# Patient Record
Sex: Female | Born: 1970 | Race: White | Hispanic: No
Health system: Midwestern US, Academic
[De-identification: ages and names within clinical notes are randomized; demographics above are authoritative.]

---

## 2018-03-16 ENCOUNTER — Ambulatory Visit: Admit: 2018-03-16 | Discharge: 2018-03-17 | Payer: MEDICARE

## 2018-03-16 ENCOUNTER — Encounter: Admit: 2018-03-16 | Discharge: 2018-03-16 | Payer: MEDICARE

## 2018-03-16 DIAGNOSIS — I10 Essential (primary) hypertension: ICD-10-CM

## 2018-03-16 DIAGNOSIS — M5116 Intervertebral disc disorders with radiculopathy, lumbar region: Principal | ICD-10-CM

## 2018-03-16 DIAGNOSIS — M48061 Spinal stenosis, lumbar region without neurogenic claudication: ICD-10-CM

## 2018-03-16 DIAGNOSIS — R079 Chest pain, unspecified: Principal | ICD-10-CM

## 2018-03-16 MED ORDER — CYCLOBENZAPRINE 10 MG PO TAB
10 mg | ORAL_TABLET | Freq: Every evening | ORAL | 0 refills | 21.00000 days | Status: AC
Start: 2018-03-16 — End: 2018-08-28

## 2018-03-16 MED ORDER — OXYCODONE-ACETAMINOPHEN 7.5-325 MG PO TAB
1 | ORAL_TABLET | ORAL | 0 refills | Status: SS | PRN
Start: 2018-03-16 — End: 2018-06-23

## 2018-03-16 MED ORDER — GABAPENTIN 600 MG PO TAB
600 mg | ORAL_TABLET | Freq: Three times a day (TID) | ORAL | 11 refills | Status: AC
Start: 2018-03-16 — End: 2019-03-22

## 2018-03-16 MED ORDER — OXYCODONE-ACETAMINOPHEN 7.5-325 MG PO TAB
1 | ORAL_TABLET | ORAL | 0 refills | 2.00000 days | Status: AC | PRN
Start: 2018-03-16 — End: 2018-06-16

## 2018-03-16 MED ORDER — OXYCODONE-ACETAMINOPHEN 7.5-325 MG PO TAB
1 | ORAL_TABLET | ORAL | 0 refills | 2.00000 days | Status: AC | PRN
Start: 2018-03-16 — End: 2018-07-16

## 2018-03-16 NOTE — Telephone Encounter
Neely from [facility] calling to verify Oxycodone prescription. Patient received 5mg  Oxycodone from PCP on 02/06/2018. Per Dr. Samara Deist ok to fill increasing dosage as prescribed today.

## 2018-03-17 ENCOUNTER — Encounter: Admit: 2018-03-17 | Discharge: 2018-03-17 | Payer: Medicare Other

## 2018-03-17 DIAGNOSIS — M5116 Intervertebral disc disorders with radiculopathy, lumbar region: Principal | ICD-10-CM

## 2018-03-24 ENCOUNTER — Encounter: Admit: 2018-03-24 | Discharge: 2018-03-24 | Payer: MEDICARE

## 2018-03-24 DIAGNOSIS — M5116 Intervertebral disc disorders with radiculopathy, lumbar region: Principal | ICD-10-CM

## 2018-04-01 ENCOUNTER — Encounter: Admit: 2018-04-01 | Discharge: 2018-04-01 | Payer: MEDICARE

## 2018-04-01 ENCOUNTER — Ambulatory Visit: Admit: 2018-04-01 | Discharge: 2018-04-02 | Payer: MEDICARE

## 2018-04-01 ENCOUNTER — Encounter: Admit: 2018-04-01 | Discharge: 2018-04-01 | Payer: Medicare Other

## 2018-04-01 DIAGNOSIS — I10 Essential (primary) hypertension: ICD-10-CM

## 2018-04-01 DIAGNOSIS — R079 Chest pain, unspecified: Principal | ICD-10-CM

## 2018-04-01 DIAGNOSIS — M48061 Spinal stenosis, lumbar region without neurogenic claudication: ICD-10-CM

## 2018-04-01 NOTE — Progress Notes

## 2018-04-01 NOTE — Patient Instructions
THANK YOU FOR COMING IN TODAY. PLEASE RESCHEDULE WHEN YOU ARE NO LONGER ON ANTIBIOTICS.

## 2018-04-02 ENCOUNTER — Encounter: Admit: 2018-04-02 | Discharge: 2018-04-02 | Payer: Medicare Other

## 2018-04-02 DIAGNOSIS — M5116 Intervertebral disc disorders with radiculopathy, lumbar region: Principal | ICD-10-CM

## 2018-04-02 DIAGNOSIS — Z538 Procedure and treatment not carried out for other reasons: ICD-10-CM

## 2018-04-08 ENCOUNTER — Encounter: Admit: 2018-04-08 | Discharge: 2018-04-08 | Payer: MEDICARE

## 2018-04-08 DIAGNOSIS — M5416 Radiculopathy, lumbar region: Principal | ICD-10-CM

## 2018-05-29 ENCOUNTER — Encounter: Admit: 2018-05-29 | Discharge: 2018-05-29 | Payer: MEDICARE

## 2018-05-29 NOTE — Progress Notes
Retrospective Drug Utilization review received and reviewed  by Dr. Sela Hua. No changes to prescribed medications at this time.

## 2018-06-15 ENCOUNTER — Encounter: Admit: 2018-06-15 | Discharge: 2018-06-15 | Payer: MEDICARE

## 2018-06-15 ENCOUNTER — Encounter: Admit: 2018-06-15 | Discharge: 2018-06-15 | Payer: Medicare Other

## 2018-06-16 MED ORDER — OXYCODONE-ACETAMINOPHEN 7.5-325 MG PO TAB
1 | ORAL_TABLET | ORAL | 0 refills | 2.00000 days | Status: DC | PRN
Start: 2018-06-16 — End: 2018-06-23

## 2018-06-20 ENCOUNTER — Encounter: Admit: 2018-06-20 | Discharge: 2018-06-20 | Payer: MEDICARE

## 2018-06-20 DIAGNOSIS — J9859 Other diseases of mediastinum, not elsewhere classified: Secondary | ICD-10-CM

## 2018-06-21 ENCOUNTER — Inpatient Hospital Stay: Admit: 2018-06-21 | Discharge: 2018-06-22 | Payer: MEDICARE

## 2018-06-21 ENCOUNTER — Encounter: Admit: 2018-06-21 | Discharge: 2018-06-21 | Payer: Medicare Other

## 2018-06-21 ENCOUNTER — Encounter: Admit: 2018-06-21 | Discharge: 2018-06-21 | Payer: MEDICARE

## 2018-06-21 DIAGNOSIS — M48061 Spinal stenosis, lumbar region without neurogenic claudication: ICD-10-CM

## 2018-06-21 DIAGNOSIS — I10 Essential (primary) hypertension: ICD-10-CM

## 2018-06-21 DIAGNOSIS — R079 Chest pain, unspecified: Principal | ICD-10-CM

## 2018-06-21 LAB — URINALYSIS DIPSTICK REFLEX TO CULTURE
Lab: POSITIVE — AB
Lab: NEGATIVE

## 2018-06-21 LAB — CBC AND DIFF
Lab: 11 g/dL — ABNORMAL LOW (ref 12.0–15.0)
Lab: 14 % (ref 11–15)
Lab: 265 10*3/uL (ref 150–400)
Lab: 27 pg (ref 26–34)
Lab: 33 g/dL (ref 32.0–36.0)
Lab: 34 % — ABNORMAL LOW (ref 36–45)
Lab: 4.2 M/UL — ABNORMAL HIGH (ref 4.0–5.0)
Lab: 4.6 K/UL (ref 4.5–11.0)
Lab: 7.9 FL (ref 7–11)
Lab: 80 FL (ref 80–100)

## 2018-06-21 LAB — URINALYSIS MICROSCOPIC REFLEX TO CULTURE

## 2018-06-21 LAB — PROTIME INR (PT): Lab: 1.2 (ref 0.8–1.2)

## 2018-06-21 LAB — URIC ACID: Lab: 5.2 mg/dL (ref 2.0–7.0)

## 2018-06-21 LAB — COVID-19 (SARS-COV-2) PCR

## 2018-06-21 LAB — MAGNESIUM: Lab: 2.2 mg/dL (ref 1.6–2.6)

## 2018-06-21 LAB — LDH-LACTATE DEHYDROGENASE: Lab: 374 U/L — ABNORMAL HIGH (ref 100–210)

## 2018-06-21 LAB — COMPREHENSIVE METABOLIC PANEL
Lab: 135 MMOL/L — ABNORMAL LOW (ref 137–147)
Lab: 3.9 MMOL/L (ref 3.5–5.1)

## 2018-06-21 LAB — PTT (APTT): Lab: 25 s (ref 24.0–36.5)

## 2018-06-21 LAB — BETA-HCG: Lab: 8 U/L — ABNORMAL HIGH (ref <5)

## 2018-06-21 LAB — FIBRINOGEN: Lab: 709 mg/dL — ABNORMAL HIGH (ref 200–400)

## 2018-06-21 LAB — PHOSPHORUS: Lab: 4.4 mg/dL (ref 2.0–4.5)

## 2018-06-21 MED ORDER — IOHEXOL 350 MG IODINE/ML IV SOLN
80 mL | Freq: Once | INTRAVENOUS | 0 refills | Status: CP
Start: 2018-06-21 — End: ?
  Administered 2018-06-21: 18:00:00 80 mL via INTRAVENOUS

## 2018-06-21 MED ORDER — SERTRALINE 25 MG PO TAB
25 mg | Freq: Every day | ORAL | 0 refills | Status: DC
Start: 2018-06-21 — End: 2018-06-21

## 2018-06-21 MED ORDER — GABAPENTIN 300 MG PO CAP
600 mg | Freq: Three times a day (TID) | ORAL | 0 refills | Status: DC
Start: 2018-06-21 — End: 2018-06-26
  Administered 2018-06-21 – 2018-06-26 (×16): 600 mg via ORAL

## 2018-06-21 MED ORDER — LISINOPRIL 20 MG PO TAB
20 mg | Freq: Every evening | ORAL | 0 refills | Status: DC
Start: 2018-06-21 — End: 2018-06-22
  Administered 2018-06-22: 02:00:00 20 mg via ORAL

## 2018-06-21 MED ORDER — CYCLOBENZAPRINE 10 MG PO TAB
10 mg | Freq: Every evening | ORAL | 0 refills | Status: DC
Start: 2018-06-21 — End: 2018-06-26
  Administered 2018-06-22 – 2018-06-26 (×5): 10 mg via ORAL

## 2018-06-21 MED ORDER — ESCITALOPRAM OXALATE 10 MG PO TAB
10 mg | Freq: Every day | ORAL | 0 refills | Status: DC
Start: 2018-06-21 — End: 2018-06-26
  Administered 2018-06-21 – 2018-06-26 (×6): 10 mg via ORAL

## 2018-06-21 MED ORDER — ENOXAPARIN 40 MG/0.4 ML SC SYRG
40 mg | Freq: Every day | SUBCUTANEOUS | 0 refills | Status: DC
Start: 2018-06-21 — End: 2018-06-21

## 2018-06-21 MED ORDER — CEFTRIAXONE INJ 1GM IVP
1 g | INTRAVENOUS | 0 refills | Status: DC
Start: 2018-06-21 — End: 2018-06-23
  Administered 2018-06-21 – 2018-06-22 (×2): 1 g via INTRAVENOUS

## 2018-06-21 MED ORDER — LACTATED RINGERS IV SOLP
500 mL | Freq: Once | INTRAVENOUS | 0 refills | Status: CP
Start: 2018-06-21 — End: ?
  Administered 2018-06-21: 15:00:00 500 mL via INTRAVENOUS

## 2018-06-21 MED ORDER — SODIUM CHLORIDE 0.9 % IJ SOLN
50 mL | Freq: Once | INTRAVENOUS | 0 refills | Status: CP
Start: 2018-06-21 — End: ?
  Administered 2018-06-21: 18:00:00 50 mL via INTRAVENOUS

## 2018-06-21 MED ORDER — CETIRIZINE 10 MG PO TAB
10 mg | Freq: Every morning | ORAL | 0 refills | Status: DC
Start: 2018-06-21 — End: 2018-06-26
  Administered 2018-06-21 – 2018-06-26 (×6): 10 mg via ORAL

## 2018-06-21 MED ORDER — NALOXEGOL 12.5 MG PO TAB
12.5 mg | Freq: Every day | ORAL | 0 refills | Status: DC
Start: 2018-06-21 — End: 2018-06-21

## 2018-06-21 MED ORDER — PANTOPRAZOLE 40 MG PO TBEC
40 mg | Freq: Two times a day (BID) | ORAL | 0 refills | Status: DC
Start: 2018-06-21 — End: 2018-06-21

## 2018-06-21 MED ORDER — NITROGLYCERIN 0.4 MG SL SUBL
.4 mg | SUBLINGUAL | 0 refills | Status: DC | PRN
Start: 2018-06-21 — End: 2018-06-21

## 2018-06-21 MED ORDER — OXYCODONE-ACETAMINOPHEN 5-325 MG PO TAB
1-2 | ORAL | 0 refills | Status: DC | PRN
Start: 2018-06-21 — End: 2018-06-26
  Administered 2018-06-22 – 2018-06-26 (×6): 1 via ORAL

## 2018-06-21 MED ORDER — IMS MIXTURE TEMPLATE
800 mg | Freq: Three times a day (TID) | ORAL | 0 refills | Status: DC
Start: 2018-06-21 — End: 2018-06-21

## 2018-06-21 NOTE — Progress Notes
Presented to ED with cough x 1 month with SOA, worse tonight     CXR --> mediastinal mass     CT scan --> anterior middle mediastinal mass measuring 4.2cmx 9.7cm x 11.9cm    probable lymphoma  cardiophrenic nerves enlarged     VS:  100/60 - 80 - 16  -94% RA 98.0    Pt reports felling SOA but resp are even and unlabored     WBC 7.7   Hgb 11.1  Hct 35.1  Plt 236  Neutro 79  Lymphs 11  NA 134  K 4.2  Cl 100  CO2 24  Gap 14  BUN 11  Creat 0.83    Albuterol given     PMH:  HTN  Depression   Chronic back pain   Splenectomy   Hysterectomy     Request for transfer for  higher level of care given new CT finding.  OSH physician is concerned about discharging patient home.

## 2018-06-22 ENCOUNTER — Encounter: Admit: 2018-06-22 | Discharge: 2018-06-22 | Payer: MEDICARE

## 2018-06-22 ENCOUNTER — Encounter: Admit: 2018-06-22 | Discharge: 2018-06-22 | Payer: Medicare Other

## 2018-06-22 LAB — HEPATITIS B SURFACE AG

## 2018-06-22 LAB — CBC AND DIFF: Lab: 3.7 10*3/uL — ABNORMAL LOW (ref 4.5–11.0)

## 2018-06-22 LAB — URIC ACID: Lab: 3.9 mg/dL — ABNORMAL LOW (ref 2.0–7.0)

## 2018-06-22 LAB — COMPREHENSIVE METABOLIC PANEL
Lab: 0.7 mg/dL — ABNORMAL HIGH (ref 0.4–1.00)
Lab: 138 MMOL/L — ABNORMAL HIGH (ref >60)

## 2018-06-22 LAB — PERIPHERAL SMEAR

## 2018-06-22 LAB — LDH-LACTATE DEHYDROGENASE: Lab: 305 U/L — ABNORMAL HIGH (ref >60)

## 2018-06-22 LAB — LEUKEMIA/LYMPHOMA PANEL FLUID/TISSUE

## 2018-06-22 LAB — HIV 1& 2 AG-AB SCRN W REFLEX HIV 1 PCR QUANT

## 2018-06-22 LAB — HEPATITIS B CORE AB TOT (IGG+IGM)

## 2018-06-22 MED ORDER — IOHEXOL 300 MG IODINE/ML IV SOLN
2 mL | Freq: Once | INTRAVENOUS | 0 refills | Status: CP
Start: 2018-06-22 — End: ?
  Administered 2018-06-22: 17:00:00 2 mL via INTRAVENOUS

## 2018-06-22 MED ORDER — MELATONIN 3 MG PO TAB
3 mg | Freq: Once | ORAL | 0 refills | Status: CP
Start: 2018-06-22 — End: ?

## 2018-06-22 MED ORDER — MIDAZOLAM 1 MG/ML IJ SOLN
0 refills | Status: CP
Start: 2018-06-22 — End: ?
  Administered 2018-06-22 (×2): 1 mg via INTRAVENOUS

## 2018-06-22 MED ORDER — SODIUM CHLORIDE 0.9 % IV SOLP
500 mL | Freq: Once | INTRAVENOUS | 0 refills | Status: CP
Start: 2018-06-22 — End: ?
  Administered 2018-06-22: 10:00:00 500 mL via INTRAVENOUS

## 2018-06-22 MED ORDER — FENTANYL CITRATE (PF) 50 MCG/ML IJ SOLN
0 refills | Status: CP
Start: 2018-06-22 — End: ?
  Administered 2018-06-22: 18:00:00 50 ug via INTRAVENOUS

## 2018-06-22 MED ORDER — SODIUM CHLORIDE 0.9 % IV SOLP
500 mL | INTRAVENOUS | 0 refills | Status: CP
Start: 2018-06-22 — End: ?
  Administered 2018-06-22: 09:00:00 500 mL via INTRAVENOUS

## 2018-06-22 MED ORDER — DEXAMETHASONE IVPB
40 mg | Freq: Once | INTRAVENOUS | 0 refills | Status: DC
Start: 2018-06-22 — End: 2018-06-23

## 2018-06-22 MED ORDER — SODIUM CHLORIDE 0.9 % IV SOLP
500 mL | Freq: Once | INTRAVENOUS | 0 refills | Status: CP
Start: 2018-06-22 — End: ?
  Administered 2018-06-22: 13:00:00 500 mL via INTRAVENOUS

## 2018-06-22 MED ORDER — ALLOPURINOL 300 MG PO TAB
300 mg | Freq: Every day | ORAL | 0 refills | Status: DC
Start: 2018-06-22 — End: 2018-06-26
  Administered 2018-06-23 – 2018-06-26 (×4): 300 mg via ORAL

## 2018-06-22 MED ORDER — SODIUM CHLORIDE 0.9 % IV SOLP
500 mL | INTRAVENOUS | 0 refills | Status: CP
Start: 2018-06-22 — End: ?
  Administered 2018-06-22: 10:00:00 500 mL via INTRAVENOUS

## 2018-06-22 MED ORDER — GUAIFENESIN 100 MG/5 ML PO LIQD
100 mg | ORAL | 0 refills | Status: DC | PRN
Start: 2018-06-22 — End: 2018-06-26
  Administered 2018-06-22 – 2018-06-23 (×2): 100 mg via ORAL

## 2018-06-22 MED ORDER — ALLOPURINOL 300 MG PO TAB
600 mg | Freq: Once | ORAL | 0 refills | Status: CP
Start: 2018-06-22 — End: ?
  Administered 2018-06-22: 21:00:00 600 mg via ORAL

## 2018-06-22 MED ORDER — DEXAMETHASONE IVPB
40 mg | Freq: Once | INTRAVENOUS | 0 refills | Status: CP
Start: 2018-06-22 — End: ?
  Administered 2018-06-23 (×2): 40 mg via INTRAVENOUS

## 2018-06-22 MED ORDER — SODIUM CHLORIDE 0.9 % IV SOLP
INTRAVENOUS | 0 refills | Status: AC
Start: 2018-06-22 — End: ?
  Administered 2018-06-22 – 2018-06-24 (×4): 1000.000 mL via INTRAVENOUS

## 2018-06-22 NOTE — Progress Notes
0340: team notified of BP 79/53, pt asymptomatic. 500 ml NS bolus order.  0440: BP 87/48 another 500 ml NS bolus order.  0520: BP 85/60 another NS at 122ml/hr order.  0645: BP 88/55, per team- order to run last of bag as bolus.  Pt asymptomatic, WCTM and pass on to day nurse.

## 2018-06-22 NOTE — Progress Notes
PRN      Objective:                          Vital Signs: Last Filed                 Vital Signs: 24 Hour Range   BP: 99/56 (05/25 0738)  Temp: 36.5 ???C (97.7 ???F) (05/25 0300)  Pulse: 88 (05/25 0300)  Respirations: 18 PER MINUTE (05/25 0300)  SpO2: 100 % (05/25 0300) BP: (79-116)/(48-74)   Temp:  [36.5 ???C (97.7 ???F)-36.9 ???C (98.4 ???F)]   Pulse:  [81-88]   Respirations:  [16 PER MINUTE-18 PER MINUTE]   SpO2:  [95 %-100 %]    Intensity Pain Scale (Self Report): 1 (06/22/18 0300) Vitals:    06/21/18 0100   Weight: 59.2 kg (130 lb 9.6 oz)       Intake/Output Summary:  (Last 24 hours)    Intake/Output Summary (Last 24 hours) at 06/22/2018 0745  Last data filed at 06/22/2018 0331  Gross per 24 hour   Intake 1150 ml   Output 1400 ml   Net -250 ml           Physical Exam  General appearance: alert, cooperative and no distress  Neurologic: Grossly normal  Lungs: clear to auscultation bilaterally  Heart: regular rate and rhythm, S1, S2 normal, no murmur, click, rub or gallop  Abdomen: soft, non-tender. Bowel sounds normal. No masses,  no organomegaly  Extremities: extremities normal, atraumatic, no cyanosis or edema    Labs Reviewed, significant changes noted in A/P      Radiology and other Diagnostics Review:    Pertinent radiology reviewed.    Ct Chest W Contrast    Result Date: 06/21/2018  CHEST: 1.  Bulky anterior mediastinal mass which encases mediastinal vasculature as described, with associated moderate narrowing of the proximal right pulmonary artery. Additional mediastinal and supraclavicular lymphadenopathy. Lymphoma is favored. 2.  Ill-defined groundglass in the right superior hilar region which may represent ill-defined lymphadenopathy or atypical infection. ABDOMEN AND PELVIS: No abdominopelvic lymphadenopathy. By my electronic signature, I attest that I have personally reviewed the images for this examination and formulated the interpretations and opinions expressed in this report  Finalized by N.

## 2018-06-23 ENCOUNTER — Encounter: Admit: 2018-06-23 | Discharge: 2018-06-23 | Payer: MEDICARE

## 2018-06-23 DIAGNOSIS — I10 Essential (primary) hypertension: ICD-10-CM

## 2018-06-23 DIAGNOSIS — R079 Chest pain, unspecified: Principal | ICD-10-CM

## 2018-06-23 DIAGNOSIS — M48061 Spinal stenosis, lumbar region without neurogenic claudication: ICD-10-CM

## 2018-06-23 LAB — COMPREHENSIVE METABOLIC PANEL
Lab: 140 MMOL/L — ABNORMAL HIGH (ref >60)
Lab: 0.2 mg/dL — ABNORMAL LOW (ref 0.3–1.2)
Lab: 0.5 mg/dL (ref 0.4–1.00)
Lab: 108 MMOL/L (ref 98–110)
Lab: 11 U/L (ref 7–40)
Lab: 131 mg/dL — ABNORMAL HIGH (ref 70–100)
Lab: 140 MMOL/L (ref 137–147)
Lab: 24 MMOL/L (ref 21–30)
Lab: 3.3 g/dL — ABNORMAL LOW (ref 3.5–5.0)
Lab: 6 U/L — ABNORMAL LOW (ref 7–56)
Lab: 6.3 g/dL (ref 6.0–8.0)
Lab: 78 U/L (ref 25–110)
Lab: 8.6 mg/dL (ref 8.5–10.6)
Lab: 9 mg/dL (ref 7–25)
Lab: >60 mL/min (ref >60)
Lab: >60 mL/min (ref >60)
Lab: 8 (ref 3–12)

## 2018-06-23 LAB — CBC AND DIFF
Lab: 2 K/UL — ABNORMAL LOW (ref >60)
Lab: 3.9 M/UL — ABNORMAL LOW (ref >60)

## 2018-06-23 LAB — PHOSPHORUS
Lab: 2.6 mg/dL (ref 2.0–4.5)
Lab: 3.2 mg/dL (ref 2.0–4.5)

## 2018-06-23 LAB — URIC ACID
Lab: 2.3 mg/dL — ABNORMAL HIGH (ref >60)
Lab: 1.7 mg/dL — ABNORMAL LOW (ref 2.0–7.0)

## 2018-06-23 LAB — BETA-HCG: Lab: 9 U/L — ABNORMAL HIGH (ref <5)

## 2018-06-23 LAB — CULTURE-URINE W/SENSITIVITY: Lab: >10

## 2018-06-23 LAB — LDH-LACTATE DEHYDROGENASE
Lab: 338 U/L — ABNORMAL HIGH (ref 100–210)
Lab: 320 U/L — ABNORMAL HIGH (ref 100–210)

## 2018-06-23 MED ORDER — RP DX F-18 FDG MCI
10 | Freq: Once | INTRAVENOUS | 0 refills | Status: CP
Start: 2018-06-23 — End: ?
  Administered 2018-06-23: 20:00:00 11.2 via INTRAVENOUS

## 2018-06-23 MED ORDER — PERFLUTREN LIPID MICROSPHERES 1.1 MG/ML IV SUSP
1-20 mL | Freq: Once | INTRAVENOUS | 0 refills | Status: CP | PRN
Start: 2018-06-23 — End: ?
  Administered 2018-06-23: 16:00:00 2 mL via INTRAVENOUS

## 2018-06-23 MED ORDER — LEVOFLOXACIN IN D5W 750 MG/150 ML IV PGBK
750 mg | INTRAVENOUS | 0 refills | Status: DC
Start: 2018-06-23 — End: 2018-06-24
  Administered 2018-06-23: 18:00:00 750 mg via INTRAVENOUS

## 2018-06-23 MED ORDER — IMS MIXTURE TEMPLATE
40 mg | Freq: Once | ORAL | 0 refills | Status: CP
Start: 2018-06-23 — End: ?
  Administered 2018-06-23 (×2): 40 mg via ORAL

## 2018-06-24 ENCOUNTER — Encounter: Admit: 2018-06-24 | Discharge: 2018-06-24 | Payer: MEDICARE

## 2018-06-24 LAB — PHOSPHORUS
Lab: 2.9 mg/dL — ABNORMAL HIGH (ref 2.0–4.5)
Lab: 3.1 mg/dL — ABNORMAL HIGH (ref 2.0–4.5)
Lab: 3.3 mg/dL (ref 2.0–4.5)

## 2018-06-24 LAB — RVP VIRAL PANEL PCR

## 2018-06-24 LAB — COMPREHENSIVE METABOLIC PANEL
Lab: 105 MMOL/L (ref 98–110)
Lab: 139 MMOL/L (ref 137–147)
Lab: 149 mg/dL — ABNORMAL HIGH (ref 70–100)
Lab: 9 mg/dL (ref 7–25)
Lab: 140 MMOL/L (ref >60)
Lab: 138 MMOL/L — ABNORMAL HIGH (ref 137–147)

## 2018-06-24 LAB — LDH-LACTATE DEHYDROGENASE
Lab: 359 U/L — ABNORMAL HIGH (ref 100–210)
Lab: 313 U/L — ABNORMAL HIGH (ref >60)
Lab: 344 U/L — ABNORMAL HIGH (ref 100–210)

## 2018-06-24 LAB — GRAM STAIN

## 2018-06-24 LAB — CBC AND DIFF: Lab: 4.4 10*3/uL — ABNORMAL LOW (ref 4.5–11.0)

## 2018-06-24 LAB — URIC ACID
Lab: 1.6 mg/dL — ABNORMAL LOW (ref 2.0–7.0)
Lab: 1.7 mg/dL — ABNORMAL LOW (ref 2.0–7.0)
Lab: 1.5 mg/dL — ABNORMAL LOW (ref 2.0–7.0)

## 2018-06-24 LAB — PROCALCITONIN: Lab: 0 ng/mL (ref <0.11)

## 2018-06-24 MED ORDER — ENOXAPARIN 40 MG/0.4 ML SC SYRG
40 mg | Freq: Every day | SUBCUTANEOUS | 0 refills | Status: CP
Start: 2018-06-24 — End: ?
  Administered 2018-06-25: 02:00:00 40 mg via SUBCUTANEOUS

## 2018-06-24 MED ORDER — PREDNISONE 50 MG PO TAB
100 mg | Freq: Every day | ORAL | 0 refills | Status: CP
Start: 2018-06-24 — End: ?
  Administered 2018-06-24 – 2018-06-26 (×3): 100 mg via ORAL

## 2018-06-24 MED ORDER — CYCLOPHOSPHAMIDE IVPB
750 mg/m2 | Freq: Once | INTRAVENOUS | 0 refills | Status: CP
Start: 2018-06-24 — End: ?
  Administered 2018-06-24 (×3): 1300 mg via INTRAVENOUS

## 2018-06-24 MED ORDER — SODIUM CHLORIDE 0.9 % IV SOLP
1000 mL | INTRAVENOUS | 0 refills | Status: CN
Start: 2018-06-24 — End: ?

## 2018-06-24 MED ORDER — ACYCLOVIR 800 MG PO TAB
800 mg | Freq: Two times a day (BID) | ORAL | 0 refills | Status: DC
Start: 2018-06-24 — End: 2018-06-26
  Administered 2018-06-24 – 2018-06-26 (×5): 800 mg via ORAL

## 2018-06-24 MED ORDER — DOXORUBICIN 2 MG/ML IV SOLN
50 mg/m2 | Freq: Once | INTRAVENOUS | 0 refills | Status: CP
Start: 2018-06-24 — End: ?
  Administered 2018-06-24: 22:00:00 83.5 mg via INTRAVENOUS

## 2018-06-24 MED ORDER — SODIUM CHLORIDE 0.9 % IV SOLP
INTRAVENOUS | 0 refills | Status: DC
Start: 2018-06-24 — End: 2018-06-25
  Administered 2018-06-25: 07:00:00 1000.000 mL via INTRAVENOUS

## 2018-06-24 MED ORDER — VINCRISTINE IVPB
2 mg | Freq: Once | INTRAVENOUS | 0 refills | Status: CP
Start: 2018-06-24 — End: ?
  Administered 2018-06-24 (×2): 2 mg via INTRAVENOUS

## 2018-06-24 MED ORDER — MAGNESIUM SULFATE IN D5W 1 GRAM/100 ML IV PGBK
1 g | Freq: Once | INTRAVENOUS | 0 refills | Status: CP
Start: 2018-06-24 — End: ?
  Administered 2018-06-24: 13:00:00 1 g via INTRAVENOUS

## 2018-06-24 MED ORDER — LEVOFLOXACIN 750 MG PO TAB
750 mg | ORAL | 0 refills | Status: DC
Start: 2018-06-24 — End: 2018-06-26
  Administered 2018-06-24 – 2018-06-26 (×3): 750 mg via ORAL

## 2018-06-24 MED ORDER — ONDANSETRON HCL 8 MG PO TAB
16 mg | Freq: Once | ORAL | 0 refills | Status: CP
Start: 2018-06-24 — End: ?
  Administered 2018-06-24: 22:00:00 16 mg via ORAL

## 2018-06-24 MED ADMIN — SODIUM CHLORIDE 0.9 % IV SOLP [27838]: 250 mL | INTRAVENOUS | @ 13:00:00 | Stop: 2018-06-24 | NDC 00338004902

## 2018-06-24 NOTE — Case Management (ED)
Case Management Admission Assessment    NAME:Kara Andersen                          MRN: [id number]             DOB:18-Dec-1970          AGE: 48 y.o.  ADMISSION DATE: 06/21/2018             DAYS ADMITTED: LOS: 3 days      Today???s Date: 06/24/2018    Source of Information: Patient via telephone due to COVID-19 precautions       Plan  Plan: Case Management Assessment, Assist PRN with SW/NCM Services, Discharge Planning for Home Anticipated     This CM met with pt for assessment on this date.  Provided contact information and explanation of SW/NCM roles.  Reviewed [facility], Preparing for Discharge, and Preferred Provider Network hand-outs.  Provided opportunity for questions and discussion. Pt/family encouraged to contact Case Management team with questions and concerns during hospitalization and until patient is able to transition back to the patient's primary care physician.    *NCM visited with patient at bedside.  *Pt states she is independent with self-care & ADL's PTA & has no concerns for discharge at this time. She will be moving into a 1 level home in June and her daughter, Lorie Phenix and her 2 kids (75 yo boy and 60 yo girl) as well as her son Cranford Mon will be moving in with her.  *DC Transport:  Pts daughter, Laurena Slimmer: [phone]. She is not currently working and able to pick patient up at any time. NCM discussed trying for DCT being before 11AM or 12PM to allow pts for drive time, medication pick-up, and to call back in with any questions/concerns before end of day. Pt verbalized understanding and also stated to ensure her medications are sent to her pharmacy under her maiden name [name]. NCM updated Hematology team  *DME:  None  *HH / HI:  None  *SNF / IPR / LTACH:  None  *Primary Insurance:  Medicare A & B  *Secondary Insurance:  [employer] [place] Medicaid  *Rx coverage:  Through Medicaid. [facility] [place], [place] Prior level of function: Independent  ? Cognitive Abilities   Cognitive Abilities: Alert and Oriented, Engages in problem solving and planning, Recognizes impact of health condition on lifestyle, Participates in decision making, Understands nature of health condition    Financial Resources  ? Coverage  Primary Insurance: Medicare(A & B)  Secondary Insurance: Medicaid([employer] [place] Medicaid)  Additional Coverage: RX(through Medicaid. [facility] [place])    ? Source of Income   Source Of Income: SSDI(Pt has been on SSDI since 2013 or 2014 for her back problems. She receives ~$960/mo)  ? Financial Assistance Needed?  None    Psychosocial Needs  ? Mental Health  Mental Health History: No  ? Substance Use History  Substance Use History Screen: No  ? Other  None    Current/Previous Services  ? PCP  Burnadette Pop, [phone], [phone]  ? Pharmacy    [facility] [address], [place] [address] [place] [address] [place] [address]  [place] [address]  Phone: [phone] Fax: [phone]    [id number] [place] [place] - [address]  [address] [place]  [place] [address]  Phone: [phone] Fax: [phone]    ? Durable  Medical Equipment   Durable Medical Equipment at home: None  ? Home Health  Receiving home health: No  ? Hemodialysis or Peritoneal Dialysis  Undergoing hemodialysis or peritoneal dialysis: No  ? Tube/Enteral Feeds  Receive tube/enteral feeds: No  ? Infusion  Receive infusions: No  ? Private Duty  Private duty help used: No  ? Home and Community Based Services  Home and community based services: No  ? [employer]: N/A  ? Hospice  Hospice: No  ? Outpatient Therapy  PT: No  OT: No  SLP: No  ? Skilled Nursing Facility/Nursing Home  SNF: No  NH: No  ? Inpatient Rehab  IPR: No  ? Long-Term Acute Care Hospital  LTACH: No  ? Acute Hospital Stay  Acute Hospital Stay: No    Clent Demark, RN, BSN  Nurse Case Manager  Hematology / Surgical Non-Transplant  Phone: [phone] Pager:  [phone]  Fax:  [phone]

## 2018-06-24 NOTE — Consults
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     Assessment:   - Admitted for R-CHOP, history of one month of cough and SOB.   - Review of imaging significant for large mediastinal mass encasing mediastinal vasculature. Mass biopsy revealed B cell lymphoma. PET CT with hypermetabolic activity throughout bone marrow and thoracic lymph nodes.  - A/O, NAD, endorses no complaints.  - Labs, medications, and allergies meet procedural protocol.   -   Platelet Count   Date Value Ref Range Status   06/24/2018 232 150 - 400 K/UL Final   ;   INR   Date Value Ref Range Status   06/21/2018 1.2 0.8 - 1.2 Final       Plan:  - This case has been reviewed and added onto the [facility] schedule for tomorrow morning.   - For sedation purposes, please keep NPO prior to procedure. May take PO medications with sips of water.    We appreciate being able to participate in this patient's care. Please page with any questions or concerns.    Lorelee Market, APRN-NP   Pgr [phone]    IR Team Pager [phone] (After-hours and Weekends)  __________________________________________________________________      Procedure: Bone marrow aspiration and biopsy.     IR Pre Procedure Notes:  Consent in chart  __________________________________________________________________    Chief Complaint:  DLBCL    Previous Anesthetic/Sedation History:  Reviewed.    History of present illness:  Kara Andersen is a 48 y.o. female patient with hx of new DLBCL. IR 12.9 oz)         Intake/Output Summary:  (Last 24 hours)    Intake/Output Summary (Last 24 hours) at 06/24/2018 1619  Last data filed at 06/24/2018 1328  Gross per 24 hour   Intake 3350 ml   Output 2000 ml   Net 1350 ml      Stool Occurrence: 1    Physical Exam  General appearance: alert and no distress   Neurologic: Grossly normal, at baseline  Lungs: Nonlabored with normal effort  Abdomen: soft, non-tender.   Extremities: extremities normal, atraumatic, no cyanosis or edema      Airway:  airway assessment performed  Mallampati II (soft palate, uvula, fauces visible)   Anesthesia Classification:  ASA III (A patient with a severe systemic disease that limits activity, but is not incapacitating)  Pre procedure anxiolysis plan: Midazolam  Intra-procedural Sedation/Medication Plan: Fentanyl, Lidocaine and Midazolam  Personal history of sedation complications: Denies adverse event.   Family history of sedation complications: Denies adverse event.   Medications for Reversal: Naloxone and Flumazenil  Discussion/Reviews:  Physician has discussed risks and alternatives of this type of sedation and above planned procedures with patient  NPO Status: Acceptable  Pregnancy Status: Not Pregnant    Lab/Radiology/Other Diagnostic Tests:  Labs:    24-hour labs:  Results for orders placed or performed during the hospital encounter of 06/21/18 (from the past 24 hour(s))   RVP VIRAL PANEL PCR    Collection Time: 06/23/18  6:13 PM   Result Value Ref Range    Specimen Source       [facility]  This panel does not detect SARS-CoV-2, the etiologic agent of COVID-19. Contact   Infection Control for testing of patients with suspected SARS-CoV-2 infection.   This assay uses analyte specific reagents and has not been cleared by the [place]   Food and Drug Administration. The performance characterics were determined by   the [facility].      Adenovirus NOT DETECTED DN-NOT DETECTED Coronavirus 229E NOT DETECTED DN-NOT DETECTED    Coronavirus HKU1 NOT DETECTED DN-NOT DETECTED    Coronavirus NL63 NOT DETECTED DN-NOT DETECTED    Coronavirus OC43 NOT DETECTED DN-NOT DETECTED    Human Metapneumovirus NOT DETECTED DN-NOT DETECTED    Human Rhinovirus/ENTEROVIRUS NOT DETECTED DN-NOT DETECTED    Influenza A H1N1 2009 NOT DETECTED DN-NOT DETECTED    Influenza A H1 NOT DETECTED DN-NOT DETECTED    Influenza A H3 NOT DETECTED DN-NOT DETECTED    Influenza B NOT DETECTED DN-NOT DETECTED    Parainfluenza 1 NOT DETECTED DN-NOT DETECTED    Parainfluenza 2 NOT DETECTED DN-NOT DETECTED    Parainfluenza 3 NOT DETECTED DN-NOT DETECTED    Parainfluenza 4 NOT DETECTED DN-NOT DETECTED    RSV NOT DETECTED DN-NOT DETECTED    Bordetella Pertussis NOT DETECTED DN-NOT DETECTED    Chlamydophila Pneumoniae NOT DETECTED DN-NOT DETECTED    Mycoplasma Pneumoniae NOT DETECTED DN-NOT DETECTED   URIC ACID    Collection Time: 06/23/18  8:15 PM   Result Value Ref Range    Uric Acid 1.6 (L) 2.0 - 7.0 MG/DL   LDH-LACTATE DEHYDROGENASE    Collection Time: 06/23/18  8:15 PM   Result Value Ref Range    Lactate Dehydrogenase 359 (H) 100 - 210 U/L   COMPREHENSIVE METABOLIC PANEL    Collection Time: 06/23/18  8:15 PM   Result Value Ref Range    Sodium 139 137 - 147 MMOL/L    Potassium 3.8 3.5 - 5.1 MMOL/L    Chloride 105 98 - 110 MMOL/L    Glucose 149 (H) 70 - 100 MG/DL    Blood Urea Nitrogen 9 7 - 25 MG/DL    Creatinine 1.61 0.4 - 1.00 MG/DL    Calcium 9.0 8.5 - 09.6 MG/DL    Total Protein 6.6 6.0 - 8.0 G/DL    Total Bilirubin 0.3 0.3 - 1.2 MG/DL    Albumin 3.6 3.5 - 5.0 G/DL    Alk Phosphatase 87 25 - 110 U/L    AST (SGOT) 12 7 - 40 U/L    CO2 25 21 - 30 MMOL/L    ALT (SGPT) 7 7 - 56 U/L    Anion Gap 9 3 - 12    eGFR Non African American >60 >60 mL/min    eGFR African American >60 >60 mL/min   PHOSPHORUS    Collection Time: 06/23/18  8:15 PM   Result Value Ref Range    Phosphorus 2.9 2.0 - 4.5 MG/DL   PROCALCITONIN

## 2018-06-24 NOTE — Progress Notes
..  CHEMO NOTE  Verified chemo consent signed and in chart.    Verified initiate chemo order in O2    Blood return positive via: PICC, RTL, white lumen    BSA and dose double checked (agree with orders as written) with: yes Elpidio Anis, chemo certified RN    Labs/applicable tests checked: CBC, CMP, EF 65%, blood return checks with doxorubicin    Chemo regime: D1C1 RCHOP    DOXOrubicin (ADRIAMYCIN) injection 83.5 mg ???:??? Ordered Dose 50 mg/m2 ??? 1.67 m2 (Treatment Plan Recorded) ???:??? Admin Dose 83.5 mg ???:??? Intravenous ???:??? ONCE     vinCRIStine (ONCOVIN) 2 mg in sodium chloride 0.9% (NS) 50 mL IVPB ???:??? Dose 2 mg ???:??? 300 mL/hr ???:??? Intravenous ???:??? ONCE     cyclophosphamide (CYTOXAN) 1,300 mg in sodium chloride 0.9% (NS) 315 mL IVPB ???:??? Ordered Dose 750 mg/m2 ??? 1.67 m2 (Treatment Plan Recorded) ???:??? Admin Dose 1,300 mg ???:??? 630 mL/hr ???:??? Intravenous ???:??? ONCE    Rate verified and armband double checkwith second RN: yes    Patient education offered and stated understanding. Denies questions at this time.

## 2018-06-25 ENCOUNTER — Inpatient Hospital Stay: Admit: 2018-06-22 | Discharge: 2018-06-22 | Payer: Medicare Other

## 2018-06-25 ENCOUNTER — Encounter: Admit: 2018-06-25 | Discharge: 2018-06-25 | Payer: MEDICARE

## 2018-06-25 DIAGNOSIS — J9859 Other diseases of mediastinum, not elsewhere classified: Secondary | ICD-10-CM

## 2018-06-25 LAB — COMPREHENSIVE METABOLIC PANEL
Lab: 0.2 mg/dL — ABNORMAL LOW (ref 0.3–1.2)
Lab: 0.6 mg/dL (ref 0.4–1.00)
Lab: 106 MMOL/L (ref 98–110)
Lab: 137 MMOL/L (ref 137–147)
Lab: 14 U/L (ref 7–40)
Lab: 14 mg/dL (ref 7–25)
Lab: 201 mg/dL — ABNORMAL HIGH (ref 70–100)
Lab: 23 MMOL/L (ref 21–30)
Lab: 3.1 g/dL — ABNORMAL LOW (ref 3.5–5.0)
Lab: 5.7 g/dL — ABNORMAL LOW (ref 6.0–8.0)
Lab: 68 U/L (ref 25–110)
Lab: 7.8 mg/dL — ABNORMAL LOW (ref 8.5–10.6)
Lab: 8 (ref 3–12)
Lab: 9 U/L (ref 7–56)
Lab: >60 mL/min (ref >60)
Lab: >60 mL/min (ref >60)
Lab: 139 MMOL/L — ABNORMAL LOW (ref >60)
Lab: 0.3 mg/dL (ref 0.3–1.2)
Lab: 0.6 mg/dL — ABNORMAL HIGH (ref >60)
Lab: 11 mg/dL — ABNORMAL LOW (ref >60)
Lab: 12 U/L (ref 7–40)
Lab: 138 MMOL/L — ABNORMAL HIGH (ref 137–147)
Lab: 24 MMOL/L (ref 21–30)
Lab: 3.1 g/dL — ABNORMAL LOW (ref 3.5–5.0)
Lab: 5.5 g/dL — ABNORMAL LOW (ref 6.0–8.0)
Lab: 68 U/L — ABNORMAL LOW (ref 25–110)
Lab: 7.5 mg/dL — ABNORMAL LOW (ref >60)
Lab: 9 (ref 3–12)
Lab: 9 U/L (ref 7–56)
Lab: >60 mL/min (ref >60)
Lab: >60 mL/min (ref >60)

## 2018-06-25 LAB — URIC ACID
Lab: 1.6 mg/dL — ABNORMAL LOW (ref 2.0–7.0)
Lab: 1.7 mg/dL — ABNORMAL LOW (ref 2.0–7.0)
Lab: 1.5 mg/dL — ABNORMAL LOW (ref >60)

## 2018-06-25 LAB — PHOSPHORUS
Lab: 2.1 mg/dL — ABNORMAL LOW (ref 2.0–4.5)
Lab: 3.1 mg/dL — ABNORMAL HIGH (ref 2.0–4.5)
Lab: 2.5 mg/dL — ABNORMAL LOW (ref >60)

## 2018-06-25 LAB — CBC AND DIFF: Lab: 4.5 K/UL — ABNORMAL HIGH (ref 4.5–11.0)

## 2018-06-25 LAB — LDH-LACTATE DEHYDROGENASE
Lab: 268 U/L — ABNORMAL HIGH (ref 100–210)
Lab: 261 U/L — ABNORMAL HIGH (ref >60)
Lab: 304 U/L — ABNORMAL HIGH (ref 100–210)

## 2018-06-25 MED ORDER — NALOXONE 0.4 MG/ML IJ SOLN
.08 mg | INTRAVENOUS | 0 refills | Status: DC | PRN
Start: 2018-06-25 — End: 2018-06-26

## 2018-06-25 MED ORDER — FENTANYL CITRATE (PF) 50 MCG/ML IJ SOLN
0 refills | Status: CP
  Administered 2018-06-25 (×2): 50 ug via INTRAVENOUS

## 2018-06-25 MED ORDER — MELATONIN 3 MG PO TAB
3 mg | Freq: Every evening | ORAL | 0 refills | Status: DC | PRN
Start: 2018-06-25 — End: 2018-06-26
  Administered 2018-06-26: 05:00:00 3 mg via ORAL

## 2018-06-25 MED ORDER — MIDAZOLAM 1 MG/ML IJ SOLN
1 mg | Freq: Once | INTRAVENOUS | 0 refills | Status: CP
Start: 2018-06-25 — End: ?
  Administered 2018-06-25: 13:00:00 1 mg via INTRAVENOUS

## 2018-06-25 MED ORDER — ACETAMINOPHEN 325 MG PO TAB
650 mg | Freq: Once | ORAL | 0 refills | Status: CP
Start: 2018-06-25 — End: ?

## 2018-06-25 MED ORDER — FLUMAZENIL 0.1 MG/ML IV SOLN
.2 mg | INTRAVENOUS | 0 refills | Status: DC | PRN
Start: 2018-06-25 — End: 2018-06-26

## 2018-06-25 MED ORDER — MAGNESIUM SULFATE IN D5W 1 GRAM/100 ML IV PGBK
1 g | Freq: Once | INTRAVENOUS | 0 refills | Status: CP
Start: 2018-06-25 — End: ?
  Administered 2018-06-25: 15:00:00 1 g via INTRAVENOUS

## 2018-06-25 MED ORDER — RITUXIMAB-PVVR IVPB
375 mg/m2 | Freq: Once | INTRAVENOUS | 0 refills | Status: CP
Start: 2018-06-25 — End: ?
  Administered 2018-06-25 (×3): 650 mg via INTRAVENOUS

## 2018-06-25 MED ORDER — MIDAZOLAM 1 MG/ML IJ SOLN
0 refills | Status: CP
  Administered 2018-06-25 (×2): 1 mg via INTRAVENOUS

## 2018-06-25 MED ORDER — MEPERIDINE (PF) 25 MG/ML IJ SYRG
25 mg | INTRAVENOUS | 0 refills | Status: DC | PRN
Start: 2018-06-25 — End: 2018-06-26

## 2018-06-25 MED ORDER — DIPHENHYDRAMINE HCL 25 MG PO CAP
25 mg | Freq: Once | ORAL | 0 refills | Status: CP
Start: 2018-06-25 — End: ?
  Administered 2018-06-25: 17:00:00 25 mg via ORAL

## 2018-06-25 NOTE — Progress Notes
VSS. Pt taken back to her unit by transport.

## 2018-06-25 NOTE — Telephone Encounter
Patient was scheduled for a telehealth visit at 2:30pm today, 06/25/18. Patient is at BMT right now and is going to need to reschedule her appt. She asked her provider there to contact our office regarding cancelling this appt. Offered to help her reschedule and she says she will call back next week once she gets her schedule figured out.

## 2018-06-25 NOTE — Patient Instructions
???   Bright red blood soaks the bandage.??????  ??? You have new or worsened pain.??? Some soreness is to be expected.  ??? You have new or worsened signs of infection such as:  ????????????????????????????????????????????? -Redness or swelling  ????????????????????????????????????????????? -Fever greater than 101?F  ???  You or your caregiver should call 911 for any severe bleeding, dizziness, shortness of breath or loss of consciousness.  ???  For any of the above symptoms or for problems or concerns related to the procedure,??????call??? [phone] for Monday-Friday 7-5.??? After-hours and weekends, please call??????[phone] and ask for the Interventional Radiology Resident on-call.

## 2018-06-25 NOTE — Progress Notes
Sedation physician present in room. Recent vitals and patient condition reviewed between sedating physician and nurse. Reassessment completed. Determination made to proceed with planned sedation.

## 2018-06-26 ENCOUNTER — Inpatient Hospital Stay: Admit: 2018-06-23 | Discharge: 2018-06-23 | Payer: MEDICARE

## 2018-06-26 ENCOUNTER — Inpatient Hospital Stay: Admit: 2018-06-25 | Discharge: 2018-06-25 | Payer: Medicare Other

## 2018-06-26 ENCOUNTER — Inpatient Hospital Stay: Admit: 2018-06-22 | Discharge: 2018-06-22 | Payer: Medicare Other

## 2018-06-26 ENCOUNTER — Encounter: Admit: 2018-06-26 | Discharge: 2018-06-26 | Payer: MEDICARE

## 2018-06-26 ENCOUNTER — Inpatient Hospital Stay
Admit: 2018-06-21 | Discharge: 2018-06-26 | Disposition: A | Discharge status: Home / Self Care | Payer: MEDICARE | Source: Other Acute Inpatient Hospital | Attending: Hematology & Oncology

## 2018-06-26 DIAGNOSIS — Z1611 Resistance to penicillins: ICD-10-CM

## 2018-06-26 DIAGNOSIS — I313 Pericardial effusion (noninflammatory): ICD-10-CM

## 2018-06-26 DIAGNOSIS — B9620 Unspecified Escherichia coli [E. coli] as the cause of diseases classified elsewhere: ICD-10-CM

## 2018-06-26 DIAGNOSIS — J9859 Other diseases of mediastinum, not elsewhere classified: ICD-10-CM

## 2018-06-26 DIAGNOSIS — J189 Pneumonia, unspecified organism: ICD-10-CM

## 2018-06-26 DIAGNOSIS — N390 Urinary tract infection, site not specified: ICD-10-CM

## 2018-06-26 DIAGNOSIS — I959 Hypotension, unspecified: ICD-10-CM

## 2018-06-26 DIAGNOSIS — Z1159 Encounter for screening for other viral diseases: ICD-10-CM

## 2018-06-26 DIAGNOSIS — C8332 Diffuse large B-cell lymphoma, intrathoracic lymph nodes: Principal | ICD-10-CM

## 2018-06-26 DIAGNOSIS — M48062 Spinal stenosis, lumbar region with neurogenic claudication: ICD-10-CM

## 2018-06-26 DIAGNOSIS — Z1629 Resistance to other single specified antibiotic: ICD-10-CM

## 2018-06-26 DIAGNOSIS — Z1619 Resistance to other specified beta lactam antibiotics: ICD-10-CM

## 2018-06-26 DIAGNOSIS — Z87891 Personal history of nicotine dependence: ICD-10-CM

## 2018-06-26 DIAGNOSIS — Z96652 Presence of left artificial knee joint: ICD-10-CM

## 2018-06-26 DIAGNOSIS — I10 Essential (primary) hypertension: ICD-10-CM

## 2018-06-26 DIAGNOSIS — J90 Pleural effusion, not elsewhere classified: ICD-10-CM

## 2018-06-26 LAB — LDH-LACTATE DEHYDROGENASE: Lab: 326 U/L — ABNORMAL HIGH (ref 100–210)

## 2018-06-26 LAB — MANUAL DIFF
Lab: 5 % (ref 4–12)
Lab: 5 % — ABNORMAL LOW (ref 24–44)
Lab: 90 % — ABNORMAL HIGH (ref 41–77)

## 2018-06-26 LAB — VRE SCREEN

## 2018-06-26 LAB — URIC ACID: Lab: 1.9 mg/dL — ABNORMAL LOW (ref >60)

## 2018-06-26 LAB — COMPREHENSIVE METABOLIC PANEL: Lab: 139 MMOL/L — ABNORMAL LOW (ref >60)

## 2018-06-26 LAB — PHOSPHORUS: Lab: 3.1 mg/dL — ABNORMAL LOW (ref >60)

## 2018-06-26 LAB — CBC AND DIFF: Lab: 2.5 K/UL — ABNORMAL LOW (ref 4.5–11.0)

## 2018-06-26 MED ORDER — ACYCLOVIR 800 MG PO TAB
800 mg | ORAL_TABLET | Freq: Two times a day (BID) | ORAL | 0 refills | Status: DC
Start: 2018-06-26 — End: 2018-06-26

## 2018-06-26 MED ORDER — ALLOPURINOL 300 MG PO TAB
300 mg | ORAL_TABLET | Freq: Every day | ORAL | 0 refills | Status: CN
Start: 2018-06-26 — End: ?

## 2018-06-26 MED ORDER — LEVOFLOXACIN 750 MG PO TAB
750 mg | ORAL_TABLET | ORAL | 0 refills | 7.00000 days | Status: AC
Start: 2018-06-26 — End: ?

## 2018-06-26 MED ORDER — LISINOPRIL 20 MG PO TAB
20 mg | Freq: Every day | ORAL | 0 refills | Status: DC
Start: 2018-06-26 — End: 2018-06-26
  Administered 2018-06-26: 17:00:00 20 mg via ORAL

## 2018-06-26 MED ORDER — ACYCLOVIR 800 MG PO TAB
800 mg | ORAL_TABLET | Freq: Two times a day (BID) | ORAL | 0 refills | Status: DC
Start: 2018-06-26 — End: 2018-12-03

## 2018-06-26 MED ORDER — ALLOPURINOL 300 MG PO TAB
300 mg | ORAL_TABLET | Freq: Every day | ORAL | 0 refills | 60.00000 days | Status: DC
Start: 2018-06-26 — End: 2018-08-25

## 2018-06-26 MED ORDER — LEVOFLOXACIN 750 MG PO TAB
750 mg | ORAL_TABLET | ORAL | 0 refills | 7.00000 days | Status: DC
Start: 2018-06-26 — End: 2018-06-26

## 2018-06-26 MED ORDER — ONDANSETRON HCL 8 MG PO TAB
8 mg | ORAL_TABLET | ORAL | 1 refills | 8.00000 days | Status: DC | PRN
Start: 2018-06-26 — End: 2018-12-03

## 2018-06-26 MED ORDER — ALLOPURINOL 300 MG PO TAB
300 mg | ORAL_TABLET | Freq: Every day | ORAL | 0 refills | 60.00000 days | Status: DC
Start: 2018-06-26 — End: 2018-06-26

## 2018-06-26 MED ORDER — PEGFILGRASTIM-CBQV 6 MG/0.6 ML SC SYRG
6 mg | Freq: Once | SUBCUTANEOUS | 0 refills | Status: CN
Start: 2018-06-26 — End: ?

## 2018-06-26 NOTE — Telephone Encounter
Heme/BMT Navigation Intake Assessment Document    Patient Name:  Kara Andersen  DOB:  [birth year]    Date of Referral:  06/24/18    Diagnosis & Reason for Visit:  Establishing Care - DLBCL      [place]-MR:  [id number] APPOINTMENT:  Future Appointments   Date Time Provider Department Center   06/28/2018 10:00 AM [facility] TREATMENT [facility] [place] [facility]   06/30/2018 12:40 PM Paulene Floor, Phillis Knack, MD [facility] [place] Exam   07/27/2018 11:45 AM Gabriel Rung, MD [facility] SPINE     REFERRING PHYSICIAN:  Dr. Cyndee Brightly       FACILITY:  Inpatient Hematology                         INSURANCE:   Medicare   Allergies:    Family Hx:     Medical Hx: DLBCL, anxiety/depression, HTN   Surgical Hx: Splenectomy, hysterectomy    Social Hx:      HPI:  Per Dr. Burnard Leigh note:   Kara Andersen???is a 48 y.o.???female???w/ PMH significant for HTN, chronic back pain, history of splenectomy and hysterectomy who presented to OSH with cough and SOB and was found to have a mediastinal mass on CT chest.  Biopsy of mass is consistent with DLBCL. Starting R-CHOP while inpatient.    Timeline of Events:   DATES            06/21/18 CT - Chest, Abd/Pelvis  CHEST:  1. ???Bulky anterior mediastinal mass which encases mediastinal vasculature as described, with associated moderate narrowing of the proximal right pulmonary artery. Additional mediastinal and supraclavicular lymphadenopathy. Lymphoma is favored.  2. ???Ill-defined groundglass in the right superior hilar region which may represent ill-defined lymphadenopathy or atypical infection.    ABDOMEN AND PELVIS:  No abdominopelvic lymphadenopathy.   06/22/18 Mass BX Lymphoid tissue, mediastinum mass, needle core biopsy: Diffuse large B-cell lymphoma. See comment. ???   Comment:   Immunohistochemical and in situ hybridization stains performed on A1 show the tumor cells are positive for CD20, BCL2, BCL6, MIB-1 (30-40%) and negative for EBV, CD3, CD5, CD10, MUM-1 (less than 30%), cMYC, cyclin D1   supporting a germinal center type DLBCL. Genetic Counseling:  Genetic Assessment: Not assessed at this time        Nutrition:  Additional Nutrition Assessment: Not assessed at this time     Social & Financial:  Social and Financial Assessment: Reports adequate support system;No needs identified     Social and Financial Intervention: Provided information about available services    Spiritual & Emotional:  Spiritual and Emotional Assessment: Reports adequate support system;No needs identified  Spiritual and Emotional Intervention: Provided information about available services    Physical:  Fall Risk: None identified     Communication:  Communication Barrier: No     Onc Fertility:   Onc Fertility Assessment: Not applicable     Additional Education:  Additional Education Documented: No    Patient Education       COVID-19 guidelines reviewed with patient, including: no visitor and universal mask policies, and a temperature check at the facility entrance upon arrival.

## 2018-06-26 NOTE — Discharge Planning (AHS/AVS)
You are scheduled for a follow up appointment for Udenyca and PICC labs at [facility] clinic at 10:00 AM on Sunday, 06/28/18. Please arrive 10 minutes prior to your appointment time and go to the 3rd floor of [facility].    It was a pleasure taking care of you!    Clent Demark, RN, BSN  Nurse Case Manager  Hematology / Surgical Non-Transplant  Phone: [phone]  Pager:  [phone]  Fax:  [phone]

## 2018-06-26 NOTE — Discharge Instructions - Pharmacy
Take one tablet by mouth three times daily.; Quantity: 90 tablet; Refills:   11  ??? lisinopriL 20 mg tablet; Commonly known as: ZESTRIL; Dose: 20 mg;   Refills: 0  ??? nitroglycerin 0.4 mg tablet; Commonly known as: NITROSTAT; Dose: 0.4 mg;   Refills: 0  ??? oxyCODONE-acetaminophen 7.5-325 mg tablet; Commonly known as: PERCOCET;   Dose: 1 tablet; Take one tablet by mouth every 8 hours as needed;   Quantity: 90 tablet; Refills: 0  ??? WAL-ZYR (CETIRIZINE) 10 mg tablet; Generic drug: cetirizine; Dose: 10   mg; Refills: 0       Return Appointments and Scheduled Appointments     Scheduled appointments:    Jun 30, 2018 12:40 PM CDT  (Arrive by 12:25 PM)  New Patient with Violeta Gelinas, MD  [facility] Exam) [facility]  [address] [place] [address]  [phone]   Jul 27, 2018 11:45 AM CDT  PROCEDURE 15 with Gabriel Rung, MD  [facility]) [address]  [address]  [place] [address]  [phone]    Additional appointment instructions:      Labs will need to be done in [facility] at the [facility] since you'll also be getting a Neulasta shot.                 Consults, Procedures, Diagnostics, Micro, Pathology   Consults: None  Surgical Procedures & Dates: None  Significant Diagnostic Studies, Micro and Procedures: BMBx  Significant Pathology: See pathology report under results review      Discharge Disposition, Condition   Patient Disposition: Home  Condition at Discharge: Stable    Code Status   Full Code  Patient Instructions        Regular Diet    You have no dietary restriction. Please continue with a healthy balanced diet.     IMPLEMENT GENERAL IV LINE FLUSH PROTOCOL   Standing Status: Standing Number of Occurrences: 1 Standing Exp. Date: 06/24/19     IMPLEMENT MEDICATION INFUSION REACTION, ANAPHYLAXIS, AND HYPERSENSITIVITY MANAGEMENT

## 2018-06-28 ENCOUNTER — Encounter: Admit: 2018-06-28 | Discharge: 2018-06-28 | Payer: MEDICARE

## 2018-06-28 DIAGNOSIS — C8338 Diffuse large B-cell lymphoma, lymph nodes of multiple sites: ICD-10-CM

## 2018-06-28 DIAGNOSIS — C8332 Diffuse large B-cell lymphoma, intrathoracic lymph nodes: ICD-10-CM

## 2018-06-28 DIAGNOSIS — C8339 Diffuse large B-cell lymphoma, extranodal and solid organ sites: Principal | ICD-10-CM

## 2018-06-28 DIAGNOSIS — Z5111 Encounter for antineoplastic chemotherapy: ICD-10-CM

## 2018-06-28 DIAGNOSIS — J9859 Other diseases of mediastinum, not elsewhere classified: ICD-10-CM

## 2018-06-28 LAB — CBC AND DIFF
Lab: 0 % (ref >60)
Lab: 0 % — ABNORMAL LOW (ref 4–12)
Lab: 0 10*3/uL (ref 0–0.20)
Lab: 0 10*3/uL (ref 0–0.80)
Lab: 0.1 10*3/uL (ref 0–0.45)
Lab: 0.1 10*3/uL — ABNORMAL LOW (ref 1.0–4.8)
Lab: 13 % (ref 11–15)
Lab: 232 10*3/uL (ref 150–400)
Lab: 26 pg (ref 26–34)
Lab: 3 % (ref >60)
Lab: 3 % — ABNORMAL LOW (ref 24–44)
Lab: 3.6 10*3/uL (ref 1.8–7.0)
Lab: 3.8 K/UL — ABNORMAL LOW (ref 4.5–11.0)
Lab: 32 % — ABNORMAL LOW (ref 36–45)
Lab: 4.1 M/UL (ref 4.0–5.0)
Lab: 8.3 FL (ref 7–11)
Lab: 94 % — ABNORMAL HIGH (ref 41–77)

## 2018-06-28 LAB — URIC ACID: Lab: 2.1 mg/dL (ref 2.0–7.0)

## 2018-06-28 LAB — COMPREHENSIVE METABOLIC PANEL
Lab: 134 MMOL/L — ABNORMAL LOW (ref 137–147)
Lab: 3.1 MMOL/L — ABNORMAL LOW (ref 3.5–5.1)

## 2018-06-28 LAB — PHOSPHORUS: Lab: 3.3 mg/dL — ABNORMAL LOW (ref 2.0–4.5)

## 2018-06-28 LAB — LDH-LACTATE DEHYDROGENASE: Lab: 296 U/L — ABNORMAL HIGH (ref 100–210)

## 2018-06-28 MED ORDER — PEGFILGRASTIM-CBQV 6 MG/0.6 ML SC SYRG
6 mg | Freq: Once | SUBCUTANEOUS | 0 refills | Status: CP
Start: 2018-06-28 — End: ?
  Administered 2018-06-28: 15:00:00 6 mg via SUBCUTANEOUS

## 2018-06-28 NOTE — Progress Notes
Labs drawn from picc line.  Educated pt on picc line care and dressing changes.  Pt will talk to Dr. Mikey Bussing on Tuesday about setting up weekly dressing changes.  Dressing changed.  Patient received udenyca and tolerated without difficulty.  No pertinent changes since last assessment.  A variety of education given to the pt regarding her new diagnosis and treatment.

## 2018-06-30 ENCOUNTER — Encounter: Admit: 2018-06-30 | Discharge: 2018-06-30

## 2018-06-30 DIAGNOSIS — R079 Chest pain, unspecified: Secondary | ICD-10-CM

## 2018-06-30 DIAGNOSIS — G894 Chronic pain syndrome: Secondary | ICD-10-CM

## 2018-06-30 DIAGNOSIS — M48061 Spinal stenosis, lumbar region without neurogenic claudication: Secondary | ICD-10-CM

## 2018-06-30 DIAGNOSIS — C8332 Diffuse large B-cell lymphoma, intrathoracic lymph nodes: Principal | ICD-10-CM

## 2018-06-30 DIAGNOSIS — R05 Cough: Secondary | ICD-10-CM

## 2018-06-30 DIAGNOSIS — Z1159 Encounter for screening for other viral diseases: Secondary | ICD-10-CM

## 2018-06-30 DIAGNOSIS — I10 Essential (primary) hypertension: Secondary | ICD-10-CM

## 2018-06-30 DIAGNOSIS — G629 Polyneuropathy, unspecified: Secondary | ICD-10-CM

## 2018-06-30 MED ORDER — DOXORUBICIN 2 MG/ML IV SOLN
50 mg/m2 | Freq: Once | INTRAVENOUS | 0 refills | Status: CN
Start: 2018-06-30 — End: ?

## 2018-06-30 MED ORDER — ONDANSETRON HCL 8 MG PO TAB
16 mg | Freq: Once | ORAL | 0 refills | Status: CN
Start: 2018-06-30 — End: ?

## 2018-06-30 MED ORDER — ACETAMINOPHEN 325 MG PO TAB
650 mg | Freq: Once | ORAL | 0 refills | Status: CN
Start: 2018-06-30 — End: ?

## 2018-06-30 MED ORDER — RITUXIMAB-PVVR IVPB
375 mg/m2 | Freq: Once | INTRAVENOUS | 0 refills | Status: CN
Start: 2018-06-30 — End: ?

## 2018-06-30 MED ORDER — DIPHENHYDRAMINE HCL 25 MG PO CAP
25 mg | Freq: Once | ORAL | 0 refills | Status: CN
Start: 2018-06-30 — End: ?

## 2018-06-30 MED ORDER — CYCLOPHOSPHAMIDE IVPB
750 mg/m2 | Freq: Once | INTRAVENOUS | 0 refills | Status: CN
Start: 2018-06-30 — End: ?

## 2018-06-30 MED ORDER — PEGFILGRASTIM-CBQV 6 MG/0.6 ML SC SYRG
6 mg | Freq: Once | SUBCUTANEOUS | 0 refills | Status: CN
Start: 2018-06-30 — End: ?

## 2018-06-30 MED ORDER — VINCRISTINE IVPB
2 mg | Freq: Once | INTRAVENOUS | 0 refills | Status: CN
Start: 2018-06-30 — End: ?

## 2018-06-30 MED ORDER — PREDNISONE 50 MG PO TAB
100 mg | ORAL_TABLET | Freq: Every day | ORAL | 4 refills | Status: DC
Start: 2018-06-30 — End: 2018-12-03
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
## 2018-07-05 ENCOUNTER — Encounter: Admit: 2018-07-05 | Discharge: 2018-07-05

## 2018-07-05 DIAGNOSIS — C8332 Diffuse large B-cell lymphoma, intrathoracic lymph nodes: Principal | ICD-10-CM

## 2018-07-05 LAB — CBC AND DIFF
Lab: 1 %
Lab: 1 %
Lab: 1.8 10*3/uL (ref 1.8–7.0)
Lab: 14 % (ref 11–15)
Lab: 18 % — ABNORMAL HIGH (ref 0–10)
Lab: 2 % (ref >60)
Lab: 2.2 K/UL — ABNORMAL LOW (ref 4.5–11.0)
Lab: 25 pg — ABNORMAL LOW (ref 26–34)
Lab: 31 % — ABNORMAL LOW (ref 36–45)
Lab: 4 % — ABNORMAL LOW (ref 24–44)
Lab: 4 M/UL — ABNORMAL HIGH (ref 4.0–5.0)
Lab: 65 % (ref 41–77)
Lab: 77 K/UL — ABNORMAL LOW (ref 150–400)
Lab: 9 % (ref >60)
Lab: 9.3 FL (ref 7–11)

## 2018-07-05 LAB — IMMUNOGLOBULINS-IGA,IGG,IGM
Lab: 157 mg/dL (ref 70–390)
Lab: 53 mg/dL (ref 38–328)
Lab: 859 mg/dL (ref 762–1488)

## 2018-07-05 LAB — COMPREHENSIVE METABOLIC PANEL
Lab: 130 MMOL/L — ABNORMAL LOW (ref 137–147)
Lab: 3.6 MMOL/L — ABNORMAL HIGH (ref 3.5–5.1)

## 2018-07-06 LAB — KAPPA/LAMBDA FREE LIGHT CHAINS
Lab: 0.9 mg/dL — ABNORMAL LOW (ref >60)
Lab: 1.1 mg/dL (ref >60)

## 2018-07-06 MED ORDER — VINCRISTINE IVPB
2 mg | Freq: Once | INTRAVENOUS | 0 refills | Status: CN
Start: 2018-07-06 — End: ?

## 2018-07-06 MED ORDER — CYCLOPHOSPHAMIDE IVPB
750 mg/m2 | Freq: Once | INTRAVENOUS | 0 refills | Status: CN
Start: 2018-07-06 — End: ?

## 2018-07-06 MED ORDER — DOXORUBICIN 2 MG/ML IV SOLN
50 mg/m2 | Freq: Once | INTRAVENOUS | 0 refills | Status: CN
Start: 2018-07-06 — End: ?

## 2018-07-07 LAB — ELECTROPHORESIS-SERUM PROTEIN
Lab: 52 % (ref >60)
Lab: 6.2 g/dL (ref >60)

## 2018-07-07 LAB — IMMUNOFIXATION, SERUM (IFES)

## 2018-07-08 ENCOUNTER — Encounter: Admit: 2018-07-08 | Discharge: 2018-07-08

## 2018-07-08 NOTE — Telephone Encounter
Kara Andersen called for clarification when she is to take her prednisione.    LVM that she is to take 2 tabs (100 mg) on days 1-5 of her chemotherapy cycle. Her day 1 of next cycle is 06/16 so she will need to take 06/16 -06/20. I asked her to please call us if she has any other questions or concerns.

## 2018-07-13 ENCOUNTER — Encounter: Admit: 2018-07-13 | Discharge: 2018-07-13

## 2018-07-13 NOTE — Telephone Encounter
Pt scheduled for Covid-19 testing on 07/12/18.  She did not show for her appointment.  Message left for Dr. Jodene Nam nurse informing her of pt's no show.

## 2018-07-13 NOTE — Telephone Encounter
Received notification from [facility] that patient did not show for scheduled swab yesterday.

## 2018-07-14 ENCOUNTER — Encounter: Admit: 2018-07-14 | Discharge: 2018-07-14

## 2018-07-14 ENCOUNTER — Encounter: Admit: 2018-07-14 | Discharge: 2018-07-15

## 2018-07-14 DIAGNOSIS — J9859 Other diseases of mediastinum, not elsewhere classified: Secondary | ICD-10-CM

## 2018-07-14 DIAGNOSIS — C8338 Diffuse large B-cell lymphoma, lymph nodes of multiple sites: Secondary | ICD-10-CM

## 2018-07-14 DIAGNOSIS — C8332 Diffuse large B-cell lymphoma, intrathoracic lymph nodes: Secondary | ICD-10-CM

## 2018-07-14 DIAGNOSIS — C8339 Diffuse large B-cell lymphoma, extranodal and solid organ sites: Secondary | ICD-10-CM

## 2018-07-14 MED ORDER — RITUXIMAB-PVVR IVPB
375 mg/m2 | Freq: Once | INTRAVENOUS | 0 refills | Status: CP
Start: 2018-07-14 — End: ?
  Administered 2018-07-14 (×3): 650 mg via INTRAVENOUS

## 2018-07-14 MED ORDER — ACETAMINOPHEN 325 MG PO TAB
650 mg | Freq: Once | ORAL | 0 refills | Status: CP
Start: 2018-07-14 — End: ?
  Administered 2018-07-14: 15:00:00 650 mg via ORAL

## 2018-07-14 MED ORDER — VINCRISTINE IVPB
2 mg | Freq: Once | INTRAVENOUS | 0 refills | Status: CP
Start: 2018-07-14 — End: ?
  Administered 2018-07-14 (×2): 2 mg via INTRAVENOUS

## 2018-07-14 MED ORDER — CYCLOPHOSPHAMIDE IVPB
750 mg/m2 | Freq: Once | INTRAVENOUS | 0 refills | Status: CP
Start: 2018-07-14 — End: ?
  Administered 2018-07-14 (×3): 1300 mg via INTRAVENOUS

## 2018-07-14 MED ORDER — ONDANSETRON HCL 8 MG PO TAB
16 mg | Freq: Once | ORAL | 0 refills | Status: CP
Start: 2018-07-14 — End: ?
  Administered 2018-07-14: 15:00:00 16 mg via ORAL

## 2018-07-14 MED ORDER — DIPHENHYDRAMINE HCL 25 MG PO CAP
25 mg | Freq: Once | ORAL | 0 refills | Status: CP
Start: 2018-07-14 — End: ?
  Administered 2018-07-14: 15:00:00 25 mg via ORAL

## 2018-07-14 MED ORDER — DOXORUBICIN 2 MG/ML IV SOLN
50 mg/m2 | Freq: Once | INTRAVENOUS | 0 refills | Status: CP
Start: 2018-07-14 — End: ?
  Administered 2018-07-14: 19:00:00 83.5 mg via INTRAVENOUS

## 2018-07-14 NOTE — Patient Instructions
Your care team:      Dr. Marc Hoffmann               Hematologist   Traver Meckes, APRN-NP               Hematology Nurse Practitioner   Kari Accurso RN, BSN  Clinical Nurse Coordinator (CNC)                 Mychart:   We strongly recommend signing up for Mychart, our patient access portal, and sending us non-urgent questions/concerns through this portal. We will reply within one business day.   This is also a great resource to view all of your labs and scan results.   Please feel free to ask our scheduler upon checkout for help setting up your Mychart access.     Phone numbers:      CNCs: [phone]  Please call our Dr. Hoffmann's nurse line if you have non-urgent questions or concerns during business hours.   Messages left on nurses line are checked Monday through Friday between the hours of 8:00 AM and 4:00 PM. Messages left after 4:00 pm will be returned the following business day.     Evening (after 4:00 PM), weekends, and holiday on-call: [phone]   For urgent needs after hours, please ask for the oncologist on-call to be paged.   For urgent needs during business hours, please ask for Kari, Dr. Hoffmann's and Edlyn Rosenburg's nurse coordinators, to be paged.     Notes:   - Allow one business week for our office to complete any requested paperwork (FMLA, etc.)   - Allow two to three business days for all medication refills. Please call your pharmacy first to check for available refills.

## 2018-07-14 NOTE — Progress Notes
CHEMO NOTE  Verified chemo consent signed and in chart.    Verified initiate chemo order in O2    Blood return positive via: PICC    BSA and dose double checked (agree with orders as written) with: yes see MAR    Labs/applicable tests checked: CBC and Comprehensive Metabolic Panel (CMP)    Chemo regime: Drug/cycle/day: C2D1   -rituximab-pvvr (RUXIENCE) 650 mg in sodium chloride 0.9% (NS) 650 mL IVPB     -DOXOrubicin (ADRIAMYCIN) injection 83.5 mg     -vinCRIStine (ONCOVIN) 2 mg in sodium chloride 0.9% (NS) 50 mL IVPB     -cyclophosphamide (CYTOXAN) 1,300 mg in sodium chloride 0.9% (NS) 315 mL IVPB       Rate verified and armband double checkwith second RN: yes    Patient education offered and stated understanding. Denies questions at this time.      Patient arrived to [place] treatment for C2D1 of RCHOP. Patient has no question, concerns or complaints at this time. 1532 Pt received treatment as ordered without incidences. Pt's PICC was flushed and good blood return noted upon completion. Pt left ambulatory with no further questions.

## 2018-07-15 ENCOUNTER — Encounter: Admit: 2018-07-15 | Discharge: 2018-07-15

## 2018-07-15 NOTE — Telephone Encounter
Patient requesting refill of oxycodone  7.5/325 mg  LOV: 03/16/2018  UOV: patient missed appointment due to recent hospitalization; rescheduled to 07/20/2018

## 2018-07-16 MED ORDER — OXYCODONE-ACETAMINOPHEN 7.5-325 MG PO TAB
1 | ORAL_TABLET | ORAL | 0 refills | 2.00000 days | Status: DC | PRN
Start: 2018-07-16 — End: 2018-08-18
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
## 2018-07-20 ENCOUNTER — Encounter: Admit: 2018-07-20 | Discharge: 2018-07-20

## 2018-07-20 DIAGNOSIS — C8590 Non-Hodgkin lymphoma, unspecified, unspecified site: Secondary | ICD-10-CM

## 2018-07-20 DIAGNOSIS — M48061 Spinal stenosis, lumbar region without neurogenic claudication: Secondary | ICD-10-CM

## 2018-07-20 DIAGNOSIS — I10 Essential (primary) hypertension: Secondary | ICD-10-CM

## 2018-07-20 DIAGNOSIS — R079 Chest pain, unspecified: Secondary | ICD-10-CM

## 2018-07-20 NOTE — Progress Notes
[facility] CLINIC NOTE     Obtained patient's verbal consent to treat them and their agreement to [facility] financial policy and NPP via this telehealth visit during the [facility]    SUBJECTIVE: Kara Andersen participated in a telephone call regarding bilateral lumbar pain. 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  Percocet 7.5/325 tid prn is taking the edge off the pain. She is also taking 600 mg po tid is also helping. Her pain is currently controlled.         Review of Systems   Constitutional: Positive for appetite change, fatigue and unexpected weight change.   HENT: Negative.    Eyes: Negative.    Respiratory: Negative.    Endocrine: Negative.    Genitourinary: Negative.    Musculoskeletal: Positive for arthralgias, back pain, gait problem and neck stiffness.   Skin: Negative.    Allergic/Immunologic: Positive for immunocompromised state.   Neurological: Positive for dizziness and light-headedness.   Hematological: Positive for adenopathy. Bruises/bleeds easily.   Psychiatric/Behavioral: Positive for agitation.       Current Outpatient Medications:   ???  acyclovir (ZOVIRAX) 800 mg tablet, Take one tablet by mouth twice daily., Disp: 60 tablet, Rfl: 0  ???  allopurinoL (ZYLOPRIM) 300 mg tablet, Take one tablet by mouth daily. Take with food., Disp: 14 tablet, Rfl: 0  ???  cetirizine (WAL-ZYR (CETIRIZINE)) 10 mg tablet, Take 10 mg by mouth every morning., Disp: , Rfl:   ???  cyclobenzaprine (FLEXERIL) 10 mg tablet, Take one tablet by mouth at bedtime daily. May take additional tablet if needed, Disp: 90 tablet, Rfl: 0  ???  diclofenac sodium DR (VOLTAREN) 75 mg tablet, Take 75 mg by mouth twice daily as needed. Take with food. , Disp: , Rfl:   ???  escitalopram oxalate (LEXAPRO) 10 mg tablet, Take 10 mg by mouth daily., Disp: , Rfl:   ???  gabapentin (NEURONTIN) 600 mg tablet, Take one tablet by mouth three times daily., Disp: 90 tablet, Rfl: 11 ???  lisinopril (ZESTRIL) 20 mg tablet, Take 20 mg by mouth daily., Disp: , Rfl:   ???  nitroglycerin (NITROSTAT) 0.4 mg tablet, Place 0.4 mg under tongue every 5 minutes as needed for Chest Pain. Max of 3 tablets, call 911., Disp: , Rfl:   ???  ondansetron (ZOFRAN) 8 mg tablet, Take one tablet by mouth every 8 hours as needed for Nausea or Vomiting., Disp: 40 tablet, Rfl: 1  ???  oxyCODONE-acetaminophen (PERCOCET) 7.5-325 mg tablet, Take one tablet by mouth every 8 hours as needed, Disp: 90 tablet, Rfl: 0  ???  predniSONE (DELTASONE) 50 mg tablet, Take two tablets by mouth daily with breakfast. X 5 days with the start of each chemotherapy cycle every 21 days, Disp: 10 tablet, Rfl: 4  Allergies   Allergen Reactions   ??? Other [Unclassified Drug] HIVES     Pt stated had a reaction to a migraine medication but doesn't remember the name.     Physical Exam  Vitals:    07/20/18 1303   Weight: 59 kg (130 lb)   Height: 162.6 cm (64)   PainSc: Five        Pain Score: Five  Body mass index is 22.31 kg/m???.         IMPRESSION:  1. Lumbar disc disease with radiculopathy    2. Lumbar radicular pain          PLAN:  Will continue with medications as described above  and schedule a 3 month clinic follow up    Visit Start Time 13:30 Visit End Time 13:50.

## 2018-07-21 ENCOUNTER — Encounter: Admit: 2018-07-21 | Discharge: 2018-07-21

## 2018-07-21 ENCOUNTER — Ambulatory Visit: Admit: 2018-07-20 | Discharge: 2018-07-21

## 2018-07-21 ENCOUNTER — Encounter: Admit: 2018-07-21 | Discharge: 2018-07-22

## 2018-07-21 DIAGNOSIS — C8332 Diffuse large B-cell lymphoma, intrathoracic lymph nodes: Principal | ICD-10-CM

## 2018-07-21 DIAGNOSIS — M5416 Radiculopathy, lumbar region: Secondary | ICD-10-CM

## 2018-07-21 DIAGNOSIS — M5116 Intervertebral disc disorders with radiculopathy, lumbar region: Secondary | ICD-10-CM

## 2018-07-21 LAB — CBC AND DIFF
Lab: 0 10*3/uL (ref 0–0.20)
Lab: 0 10*3/uL (ref 0–0.45)
Lab: 0 10*3/uL (ref 0–0.80)
Lab: 0.3 10*3/uL — ABNORMAL LOW (ref 1.0–4.8)
Lab: 1 % (ref >60)
Lab: 1 % — ABNORMAL LOW (ref 4–12)
Lab: 1.1 10*3/uL — ABNORMAL LOW (ref 1.8–7.0)
Lab: 1.5 K/UL — ABNORMAL LOW (ref 4.5–11.0)
Lab: 10 g/dL — ABNORMAL LOW (ref 12.0–15.0)
Lab: 15 % — ABNORMAL HIGH (ref 11–15)
Lab: 2 % (ref >60)
Lab: 229 10*3/uL (ref 150–400)
Lab: 23 % — ABNORMAL LOW (ref 24–44)
Lab: 27 pg (ref 26–34)
Lab: 3.8 M/UL — ABNORMAL LOW (ref 4.0–5.0)
Lab: 30 % — ABNORMAL LOW (ref 36–45)
Lab: 33 g/dL (ref 32.0–36.0)
Lab: 73 % (ref 41–77)
Lab: 79 FL — ABNORMAL LOW (ref 80–100)
Lab: 8.8 FL (ref 7–11)

## 2018-07-21 LAB — COMPREHENSIVE METABOLIC PANEL
Lab: 135 MMOL/L — ABNORMAL LOW (ref 137–147)
Lab: 3.9 MMOL/L (ref 3.5–5.1)

## 2018-07-21 NOTE — Progress Notes
Pt here for labs and a dressing change. Pt states that she is feeling good. Voices no complaints. Labs drawn from PICC line. PICC line dressing and caps changed. Pt tolerated well. Pt discharged to home in stable condition.

## 2018-07-24 ENCOUNTER — Encounter: Admit: 2018-07-24 | Discharge: 2018-07-24

## 2018-07-24 MED ORDER — RXAMB DIPH/LIDO/ANTACID 1:1:1 (COMPOUND)
10 mL | ORAL | 1 refills | 28.00000 days | Status: DC | PRN
Start: 2018-07-24 — End: 2018-12-03

## 2018-07-24 NOTE — Telephone Encounter
[place] called to report that her dry unproductive cough has returned.    I returned her call. She received C2 D1 R-CHOP 06/16. Advised for her to take both daily allergy medication like Claritin or Zyrtec and an acid reducer like Prilosec or Nexium. Also she can take Robitussin at night since can cause drowsiness. All medications can be found over the counter. Educated the importance of drinking adequately. In addition to water, she can also drink Gatorade or Propel to replenish electrolytes, her Na+ was on the low side Wednesday.    She reports a sore on the right side at the back of her tongue. I told her to rinse with 1 tsp baking soda or 1 tsp salt in 1 cup water as often as she can at least 5-6 times a day. Also we can send her in magic mouthwash for comfort measures, but the saline rinse is very important to do and to avoid spicy/ hot beverages and food. She said she does not eat spicy but does enjoy her coffee. I asked her to follow up with [place] on Monday.     She verbalized understanding of the above and thanked [place] for the coordination.

## 2018-07-28 ENCOUNTER — Encounter: Admit: 2018-07-28 | Discharge: 2018-07-28

## 2018-07-28 DIAGNOSIS — C8332 Diffuse large B-cell lymphoma, intrathoracic lymph nodes: Principal | ICD-10-CM

## 2018-07-28 NOTE — Progress Notes
Patient presents for PICC line dressing change. Tolerated well, left treatment area ambulatory and in baseline condition.

## 2018-07-28 NOTE — Patient Instructions
[place]  Discharge Instructions      Kara Andersen  07/28/2018    Treatment Received Today:  PICC line dressing change            Discharge Instructions  Call immediately to report the following:   Uncontrolled nausea or vomiting, pain, or bleeding   Temperature of 100.5 F or greater or any sign/symptom of infection (warmth, redness, tenderness)   Painful mouth or difficulty swallowing   Diarrhea    Swelling of arms or legs   Rash     Post-Treatment Directions:   Use mouth rinses after meals and at bedtime.  Use a non-alcohol commercial brand rinse   or mild salt water/baking soda rinse.     Drink 8-10 glasses of fluids daily.   Try to exercise daily to decrease fatigue.      Medication Instructions  If there are any specific medication instructions they are written below          Phone Numbers  [place] Phone # [phone]    (Answered 24 hrs a day)      For up to date information on the COVID-19 virus, visit the [facility] website. [email or web address]   General supportive care during cold and flu season and infection prevention reminders:    o Wash hands often with soap and water for at least 20 seconds   o Cover your mouth and nose   o Social distancing: try to maintain 6 feet between you and other people   o Stay home if sick and symptoms mild or manageable?   If you must be around people wear a mask     If you are having symptoms of a lower respiratory infection (cough, shortness of breath) and/or fever AND either traveled in last 30 days (internationally or to region of exposure) OR known exposure to patient with COVID19:     o Call your primary care provider for questions or health needs.    Tell your doctor about your recent travel and your symptoms     o In a medical emergency, call 911 or go to the nearest emergency room.

## 2018-07-30 ENCOUNTER — Encounter: Admit: 2018-07-30 | Discharge: 2018-07-30

## 2018-07-30 NOTE — Telephone Encounter
Kara Andersen called to report that she bit her lip a few days ago and it is painful and still not healed. She also reports dizziness and issues with balance.     I LVM to ask if she is hydrating adequately because when dehydrated can cause hypotension and dizziness. Asked her to please call us back for more information. Provided direct callback number.

## 2018-08-02 ENCOUNTER — Encounter: Admit: 2018-08-02 | Discharge: 2018-08-02

## 2018-08-02 ENCOUNTER — Ambulatory Visit: Admit: 2018-08-02 | Discharge: 2018-08-03

## 2018-08-02 DIAGNOSIS — Z1159 Encounter for screening for other viral diseases: Principal | ICD-10-CM

## 2018-08-02 NOTE — Progress Notes
Patient arrived to [place] clinic for COVID-19 testing 08/02/18 1443. Patient identity confirmed via photo I.D. Nasopharyngeal procedure explained to the patient.   Nasopharyngeal swab completed right  Patient education provided given and instructed patient self isolate until contacted w/ results and further instructions.   Swab collected by Salley Scarlet, MA.    Date symptoms began/reason for testing: Pre Chemo.

## 2018-08-04 ENCOUNTER — Encounter: Admit: 2018-08-04 | Discharge: 2018-08-04

## 2018-08-04 ENCOUNTER — Encounter: Admit: 2018-08-04 | Discharge: 2018-08-05

## 2018-08-04 DIAGNOSIS — C8332 Diffuse large B-cell lymphoma, intrathoracic lymph nodes: Secondary | ICD-10-CM

## 2018-08-04 DIAGNOSIS — M48061 Spinal stenosis, lumbar region without neurogenic claudication: Secondary | ICD-10-CM

## 2018-08-04 DIAGNOSIS — J9859 Other diseases of mediastinum, not elsewhere classified: Secondary | ICD-10-CM

## 2018-08-04 DIAGNOSIS — I10 Essential (primary) hypertension: Secondary | ICD-10-CM

## 2018-08-04 DIAGNOSIS — Z1159 Encounter for screening for other viral diseases: Secondary | ICD-10-CM

## 2018-08-04 DIAGNOSIS — R079 Chest pain, unspecified: Secondary | ICD-10-CM

## 2018-08-04 DIAGNOSIS — C8590 Non-Hodgkin lymphoma, unspecified, unspecified site: Secondary | ICD-10-CM

## 2018-08-04 LAB — COVID-19 (SARS-COV-2) PCR

## 2018-08-04 MED ORDER — DIPHENHYDRAMINE HCL 25 MG PO CAP
25 mg | Freq: Once | ORAL | 0 refills | Status: CP
Start: 2018-08-04 — End: ?
  Administered 2018-08-04: 18:00:00 25 mg via ORAL

## 2018-08-04 MED ORDER — RITUXIMAB-PVVR IVPB (SPEC VOL)
375 mg/m2 | Freq: Once | INTRAVENOUS | 0 refills | Status: CN
Start: 2018-08-04 — End: ?

## 2018-08-04 MED ORDER — ONDANSETRON HCL 8 MG PO TAB
16 mg | Freq: Once | ORAL | 0 refills | Status: CP
Start: 2018-08-04 — End: ?
  Administered 2018-08-04: 18:00:00 16 mg via ORAL

## 2018-08-04 MED ORDER — CYCLOPHOSPHAMIDE IVPB
750 mg/m2 | Freq: Once | INTRAVENOUS | 0 refills | Status: CP
Start: 2018-08-04 — End: ?
  Administered 2018-08-04 (×3): 1300 mg via INTRAVENOUS

## 2018-08-04 MED ORDER — VINCRISTINE IVPB
2 mg | Freq: Once | INTRAVENOUS | 0 refills | Status: CP
Start: 2018-08-04 — End: ?
  Administered 2018-08-04 (×2): 2 mg via INTRAVENOUS

## 2018-08-04 MED ORDER — ACETAMINOPHEN 325 MG PO TAB
650 mg | Freq: Once | ORAL | 0 refills | Status: CP
Start: 2018-08-04 — End: ?
  Administered 2018-08-04: 18:00:00 650 mg via ORAL

## 2018-08-04 MED ORDER — DOXORUBICIN 2 MG/ML IV SOLN
50 mg/m2 | Freq: Once | INTRAVENOUS | 0 refills | Status: CP
Start: 2018-08-04 — End: ?
  Administered 2018-08-04: 21:00:00 83.5 mg via INTRAVENOUS

## 2018-08-04 MED ORDER — RITUXIMAB-PVVR IVPB (SPEC VOL)
375 mg/m2 | Freq: Once | INTRAVENOUS | 0 refills | Status: CP
Start: 2018-08-04 — End: ?
  Administered 2018-08-04 (×3): 650 mg via INTRAVENOUS

## 2018-08-04 NOTE — Progress Notes
CHEMO NOTE  Verified chemo consent signed and in chart.    Verified initiate chemo order in O2    Blood return positive via: PICC    BSA and dose double checked (agree with orders as written) with: yes see MAR    Labs/applicable tests checked: CBC and Comprehensive Metabolic Panel (CMP)    Chemo regime: Drug/cycle/day  c3d1    rituximab-pvvr (RUXIENCE) 650 mg in sodium chloride 0.9% (NS) 250 mL IVPB (rapid infusion)  DOXOrubicin (ADRIAMYCIN) injection 83.5 mg   vinCRIStine (ONCOVIN) 2 mg in sodium chloride 0.9% (NS) 50 mL IVPB   cyclophosphamide (CYTOXAN) 1,300 mg in sodium chloride 0.9% (NS) 315 mL IVPB     Rate verified and armband double checkwith second RN: yes see MAR    Patient education offered and stated understanding. Denies questions at this time.    Pt arrived ambulatory to treatment. Pt denies any questions or concerns at this time. Pt tolerated treatment without complication. PICC flushed and capped. Pt left ambulatory from CC.
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
## 2018-08-05 ENCOUNTER — Encounter: Admit: 2018-08-05 | Discharge: 2018-08-05

## 2018-08-05 DIAGNOSIS — C8590 Non-Hodgkin lymphoma, unspecified, unspecified site: Secondary | ICD-10-CM

## 2018-08-05 DIAGNOSIS — J9859 Other diseases of mediastinum, not elsewhere classified: Secondary | ICD-10-CM

## 2018-08-05 DIAGNOSIS — C8338 Diffuse large B-cell lymphoma, lymph nodes of multiple sites: Secondary | ICD-10-CM

## 2018-08-05 DIAGNOSIS — M48061 Spinal stenosis, lumbar region without neurogenic claudication: Secondary | ICD-10-CM

## 2018-08-05 DIAGNOSIS — R079 Chest pain, unspecified: Secondary | ICD-10-CM

## 2018-08-05 DIAGNOSIS — I10 Essential (primary) hypertension: Secondary | ICD-10-CM

## 2018-08-05 DIAGNOSIS — C8339 Diffuse large B-cell lymphoma, extranodal and solid organ sites: Secondary | ICD-10-CM

## 2018-08-05 DIAGNOSIS — Z5111 Encounter for antineoplastic chemotherapy: Secondary | ICD-10-CM

## 2018-08-05 DIAGNOSIS — C8332 Diffuse large B-cell lymphoma, intrathoracic lymph nodes: Principal | ICD-10-CM

## 2018-08-05 MED ORDER — PEGFILGRASTIM-CBQV 6 MG/0.6 ML SC SYRG
6 mg | Freq: Once | SUBCUTANEOUS | 0 refills | Status: CP
Start: 2018-08-05 — End: ?
  Administered 2018-08-05: 20:00:00 6 mg via SUBCUTANEOUS

## 2018-08-05 NOTE — Progress Notes
Patient received Udenyca injection and tolerated without difficulty.  No pertinent changes since last assessment.  Patient left ambulatory with no complaints or concerns at this time.

## 2018-08-11 ENCOUNTER — Encounter: Admit: 2018-08-11 | Discharge: 2018-08-11

## 2018-08-11 DIAGNOSIS — C8332 Diffuse large B-cell lymphoma, intrathoracic lymph nodes: Secondary | ICD-10-CM

## 2018-08-11 LAB — COMPREHENSIVE METABOLIC PANEL
Lab: 133 MMOL/L — ABNORMAL LOW (ref 137–147)
Lab: 3.9 MMOL/L (ref 3.5–5.1)
Lab: 6.7 g/dL (ref 6.0–8.0)
Lab: 8 U/L (ref 7–56)
Lab: 92 mg/dL — ABNORMAL LOW (ref 70–100)

## 2018-08-11 LAB — CBC AND DIFF
Lab: 0.3 K/UL — CL (ref 4.5–11.0)
Lab: 108 K/UL — ABNORMAL LOW (ref 150–400)
Lab: 20 % — ABNORMAL HIGH (ref 11–15)
Lab: 26 % — ABNORMAL LOW (ref 36–45)
Lab: 33 g/dL (ref 32.0–36.0)
Lab: 80 FL (ref 80–100)

## 2018-08-17 ENCOUNTER — Encounter: Admit: 2018-08-17 | Discharge: 2018-08-17

## 2018-08-17 NOTE — Telephone Encounter
Patient requesting refill of oxycodone  7.5/325 mg  LOV: 07/20/2018  UOV: 10/19/2018  Last filled: 07/16/2018

## 2018-08-18 ENCOUNTER — Encounter: Admit: 2018-08-18 | Discharge: 2018-08-18

## 2018-08-18 DIAGNOSIS — C8332 Diffuse large B-cell lymphoma, intrathoracic lymph nodes: Principal | ICD-10-CM

## 2018-08-18 LAB — CBC AND DIFF
Lab: 1 % (ref 0–0.45)
Lab: 1 % (ref 0–0.80)
Lab: 1 K/UL — ABNORMAL LOW (ref >60)
Lab: 10 % — ABNORMAL LOW (ref 24–44)
Lab: 140 10*3/uL — ABNORMAL LOW (ref 150–400)
Lab: 18 % — ABNORMAL HIGH (ref 4–12)
Lab: 2 % — ABNORMAL LOW (ref 1.0–4.8)
Lab: 20 % — ABNORMAL HIGH (ref 11–15)
Lab: 24 % — ABNORMAL LOW (ref 36–45)
Lab: 26 pg (ref 26–34)
Lab: 3 10*3/uL (ref 1.8–7.0)
Lab: 3 M/UL — ABNORMAL LOW (ref 4.0–5.0)
Lab: 32 g/dL (ref 32.0–36.0)
Lab: 4.5 K/UL — ABNORMAL LOW (ref 4.5–11.0)
Lab: 5 % (ref >60)
Lab: 63 % (ref >60)
Lab: 8 g/dL — ABNORMAL LOW (ref 12.0–15.0)
Lab: 8.7 FL (ref 7–11)
Lab: 81 FL — ABNORMAL LOW (ref 80–100)

## 2018-08-18 LAB — COMPREHENSIVE METABOLIC PANEL
Lab: 104 MMOL/L — ABNORMAL HIGH (ref 98–110)
Lab: 139 MMOL/L (ref 137–147)
Lab: 4.1 MMOL/L (ref 3.5–5.1)

## 2018-08-18 MED ORDER — OXYCODONE-ACETAMINOPHEN 7.5-325 MG PO TAB
1 | ORAL_TABLET | ORAL | 0 refills | 2.00000 days | Status: DC | PRN
Start: 2018-08-18 — End: 2018-09-15

## 2018-08-21 ENCOUNTER — Encounter: Admit: 2018-08-21 | Discharge: 2018-08-21

## 2018-08-21 ENCOUNTER — Ambulatory Visit: Admit: 2018-08-21 | Discharge: 2018-08-21

## 2018-08-21 DIAGNOSIS — C8332 Diffuse large B-cell lymphoma, intrathoracic lymph nodes: Secondary | ICD-10-CM

## 2018-08-21 MED ORDER — IOHEXOL 350 MG IODINE/ML IV SOLN
80 mL | Freq: Once | INTRAVENOUS | 0 refills | Status: CP
Start: 2018-08-21 — End: ?
  Administered 2018-08-21: 21:00:00 80 mL via INTRAVENOUS

## 2018-08-21 MED ORDER — SODIUM CHLORIDE 0.9 % IJ SOLN
50 mL | Freq: Once | INTRAVENOUS | 0 refills | Status: CP
Start: 2018-08-21 — End: ?
  Administered 2018-08-21: 21:00:00 50 mL via INTRAVENOUS

## 2018-08-25 ENCOUNTER — Encounter: Admit: 2018-08-25 | Discharge: 2018-08-25

## 2018-08-25 DIAGNOSIS — C8339 Diffuse large B-cell lymphoma, extranodal and solid organ sites: Secondary | ICD-10-CM

## 2018-08-25 DIAGNOSIS — G629 Polyneuropathy, unspecified: Secondary | ICD-10-CM

## 2018-08-25 DIAGNOSIS — R079 Chest pain, unspecified: Secondary | ICD-10-CM

## 2018-08-25 DIAGNOSIS — Z79899 Other long term (current) drug therapy: Secondary | ICD-10-CM

## 2018-08-25 DIAGNOSIS — J9859 Other diseases of mediastinum, not elsewhere classified: Secondary | ICD-10-CM

## 2018-08-25 DIAGNOSIS — C8590 Non-Hodgkin lymphoma, unspecified, unspecified site: Secondary | ICD-10-CM

## 2018-08-25 DIAGNOSIS — I10 Essential (primary) hypertension: Secondary | ICD-10-CM

## 2018-08-25 DIAGNOSIS — C8338 Diffuse large B-cell lymphoma, lymph nodes of multiple sites: Secondary | ICD-10-CM

## 2018-08-25 DIAGNOSIS — G894 Chronic pain syndrome: Secondary | ICD-10-CM

## 2018-08-25 DIAGNOSIS — C8332 Diffuse large B-cell lymphoma, intrathoracic lymph nodes: Secondary | ICD-10-CM

## 2018-08-25 DIAGNOSIS — M48061 Spinal stenosis, lumbar region without neurogenic claudication: Secondary | ICD-10-CM

## 2018-08-25 MED ORDER — VINCRISTINE IVPB
2 mg | Freq: Once | INTRAVENOUS | 0 refills | Status: CP
Start: 2018-08-25 — End: ?
  Administered 2018-08-25 (×2): 2 mg via INTRAVENOUS

## 2018-08-25 MED ORDER — DIPHENHYDRAMINE HCL 25 MG PO CAP
25 mg | Freq: Once | ORAL | 0 refills | Status: CP
Start: 2018-08-25 — End: ?
  Administered 2018-08-25: 15:00:00 25 mg via ORAL

## 2018-08-25 MED ORDER — DOXORUBICIN 2 MG/ML IV SOLN
50 mg/m2 | Freq: Once | INTRAVENOUS | 0 refills | Status: CP
Start: 2018-08-25 — End: ?
  Administered 2018-08-25: 18:00:00 83.5 mg via INTRAVENOUS

## 2018-08-25 MED ORDER — PREDNISONE 50 MG PO TAB
100 mg | Freq: Once | ORAL | 0 refills | Status: CP
Start: 2018-08-25 — End: ?
  Administered 2018-08-25: 16:00:00 100 mg via ORAL

## 2018-08-25 MED ORDER — ACETAMINOPHEN 325 MG PO TAB
650 mg | Freq: Once | ORAL | 0 refills | Status: CP
Start: 2018-08-25 — End: ?
  Administered 2018-08-25: 15:00:00 650 mg via ORAL

## 2018-08-25 MED ORDER — CYCLOPHOSPHAMIDE IVPB
750 mg/m2 | Freq: Once | INTRAVENOUS | 0 refills | Status: CP
Start: 2018-08-25 — End: ?
  Administered 2018-08-25 (×3): 1300 mg via INTRAVENOUS

## 2018-08-25 MED ORDER — ONDANSETRON HCL 8 MG PO TAB
16 mg | Freq: Once | ORAL | 0 refills | Status: CP
Start: 2018-08-25 — End: ?
  Administered 2018-08-25: 15:00:00 16 mg via ORAL

## 2018-08-25 MED ORDER — RITUXIMAB-PVVR IVPB (SPEC VOL)
375 mg/m2 | Freq: Once | INTRAVENOUS | 0 refills | Status: CP
Start: 2018-08-25 — End: ?
  Administered 2018-08-25 (×3): 650 mg via INTRAVENOUS

## 2018-08-25 NOTE — Progress Notes
CHEMO NOTE  Verified chemo consent signed and in chart.    Verified initiate chemo order in O2    Blood return positive via: PICC    BSA and dose double checked (agree with orders as written) with: yes     Labs/applicable tests checked: CBC, Comprehensive Metabolic Panel (CMP) and echo    Chemo regime: Drug/cycle/day RCHOP IV C4 D1    Rate verified and armband double checkwith second RN: yes    Patient education offered and stated understanding. Denies questions at this time.    Chemo consent signed on 06/24/18.  Pt tolerated rituxan, doxorubicin, vincristine, and cytoxan infusions without incident.  AVS given to pt, left ambulatory at 1347.

## 2018-08-25 NOTE — Patient Instructions
Call Immediately to report the following:  Unexplained bleeding or bleeding that will not stop  Difficulty swallowing  Shortness of breath, wheezing, or trouble breathing  Rapid, irregular heartbeat; chest pain  Dizziness, lightheadedness  Rash or cut that swells or turns red, feels hot or painful, or begin to ooze  Diarrhea   Uncontrolled nausea or vomiting  Fever of 100.4 F or higher, or chills    Important Phone Numbers:  Cancer Center Main Number (answered 24 hours a day) [phone]  Cancer Center Scheduling (appointments) [phone]  Social Worker [phone]  Nutritionist [phone]
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
## 2018-08-27 ENCOUNTER — Encounter: Admit: 2018-08-27 | Discharge: 2018-08-27

## 2018-08-27 DIAGNOSIS — J9859 Other diseases of mediastinum, not elsewhere classified: Secondary | ICD-10-CM

## 2018-08-27 DIAGNOSIS — C8332 Diffuse large B-cell lymphoma, intrathoracic lymph nodes: Secondary | ICD-10-CM

## 2018-08-27 MED ORDER — PEGFILGRASTIM-CBQV 6 MG/0.6 ML SC SYRG
6 mg | Freq: Once | SUBCUTANEOUS | 0 refills | Status: CP
Start: 2018-08-27 — End: ?
  Administered 2018-08-27: 21:00:00 6 mg via SUBCUTANEOUS

## 2018-08-27 NOTE — Progress Notes
Pt reports only concern at this time is some minor nausea. Pt hasnt taken any nasuea medication. Nurse educated pt to take some when she gets home. Pt otherwise has no concerns.Pt tolerated injection with no adverse reactions. Pt left left clinic ambulatory with all belongings.

## 2018-08-28 MED ORDER — CYCLOBENZAPRINE 10 MG PO TAB
ORAL_TABLET | Freq: Every evening | ORAL | 0 refills | 30.00000 days | Status: DC
Start: 2018-08-28 — End: 2018-12-09

## 2018-08-31 ENCOUNTER — Encounter: Admit: 2018-08-31 | Discharge: 2018-08-31

## 2018-08-31 ENCOUNTER — Ambulatory Visit: Admit: 2018-08-31 | Discharge: 2018-09-01

## 2018-08-31 DIAGNOSIS — Z1159 Encounter for screening for other viral diseases: Secondary | ICD-10-CM

## 2018-08-31 MED ORDER — RITUXIMAB-PVVR IVPB (SPEC VOL)
375 mg/m2 | Freq: Once | INTRAVENOUS | 0 refills | Status: CN
Start: 2018-08-31 — End: ?

## 2018-08-31 MED ORDER — AMOXICILLIN-POT CLAVULANATE 875-125 MG PO TAB
1 | ORAL_TABLET | Freq: Two times a day (BID) | ORAL | 0 refills | 7.00000 days | Status: DC
Start: 2018-08-31 — End: 2018-09-15

## 2018-08-31 NOTE — Progress Notes
Patient arrived to [place] clinic for COVID-19 testing 08/31/18 1458. Patient identity confirmed via photo I.D. Nasopharyngeal procedure explained to the patient.   Nasopharyngeal swab completed right  Patient education provided given and instructed patient self isolate until contacted w/ results and further instructions.   Swab collected by Rochele Raring RN.    Date symptoms began/reason for testing: pre op

## 2018-08-31 NOTE — Telephone Encounter
Kara Andersen called to report that her chest is heavy and feels like she has a cold.    I returned her call for assessment. She said she feels like she has a sinus infection, has a productive cough (dark yellow and green sputum), sore throat, throbbing headache. Symptoms started Saturday but have progressively become worse and she is sleeping all the time. She said she thinks she had low grade fevers but is 'bundled up."   She is taking Sudafed decongestant but noting else at this time.   I told her I will report to provider and call her back with interventions.

## 2018-09-01 ENCOUNTER — Encounter: Admit: 2018-09-01 | Discharge: 2018-09-01

## 2018-09-01 DIAGNOSIS — R079 Chest pain, unspecified: Secondary | ICD-10-CM

## 2018-09-01 DIAGNOSIS — C8332 Diffuse large B-cell lymphoma, intrathoracic lymph nodes: Secondary | ICD-10-CM

## 2018-09-01 DIAGNOSIS — M48061 Spinal stenosis, lumbar region without neurogenic claudication: Secondary | ICD-10-CM

## 2018-09-01 DIAGNOSIS — C8590 Non-Hodgkin lymphoma, unspecified, unspecified site: Secondary | ICD-10-CM

## 2018-09-01 DIAGNOSIS — I10 Essential (primary) hypertension: Secondary | ICD-10-CM

## 2018-09-01 LAB — CBC AND DIFF
Lab: 0.2 10*3/uL — CL (ref 4.5–11.0)
Lab: 22 % — ABNORMAL HIGH (ref 11–15)
Lab: 23 % — ABNORMAL LOW (ref 36–45)
Lab: 28 pg (ref 26–34)
Lab: 33 g/dL — ABNORMAL HIGH (ref 32.0–36.0)
Lab: 60 K/UL — ABNORMAL LOW (ref 150–400)
Lab: 8 g/dL — ABNORMAL LOW (ref 12.0–15.0)
Lab: 8.9 FL — ABNORMAL LOW (ref >60)
Lab: 85 FL (ref 80–100)

## 2018-09-01 LAB — COMPREHENSIVE METABOLIC PANEL
Lab: 137 MMOL/L (ref 137–147)
Lab: 28 MMOL/L (ref >60)
Lab: 3.9 MMOL/L (ref 3.5–5.1)
Lab: 9 U/L (ref 7–56)
Lab: >60 mL/min (ref >60)

## 2018-09-01 LAB — COVID-19 (SARS-COV-2) PCR

## 2018-09-01 NOTE — Progress Notes
Kara Andersen seen in clinic today

## 2018-09-01 NOTE — Patient Instructions
Notes for Poetry:   1. OTC Mucinex 1,200 mg (2 tablets) every 12 hours.   2. OTC Delsym cough syrup.   3. Drink lots of water to help thin out the mucous.   4. Check temperature every morning. Notify clinic right away if temperature at 100.5 degrees or higher.   5. Please complete the rest of the Augmentin prescription.     Neutropenic Precautions:  - Notify clinic immediately with any fevers of 100.5 degrees or higher: If during business hours: [phone]. If after hours/weekends, please call: [phone].  - Avoid sick contacts and wash hands frequently.  - Avoid live plants, flowers, gardening, yard work, or handling soil.   - Wash fruits and vegetables thoroughly. Cook all foods until well done.   - Continue acyclovir, fluconazole, and levofloxacin for infection prevention.          Your care team:      Dr. Olene Craven               Hematologist   Kavin Leech, APRN-NP               Hematology Nurse Practitioner   Einar Crow RN, BSN  Clinical Nurse Coordinator (CNC)                 Mychart:   We strongly recommend signing up for Mychart, our patient access portal, and sending [place] non-urgent questions/concerns through this portal. We will reply within one business day.   This is also a great resource to view all of your labs and scan results.   Please feel free to ask our scheduler upon checkout for help setting up your Mychart access.     Phone numbers:      CNCs: [phone]  Please call our Dr. Modena Slater nurse line if you have non-urgent questions or concerns during business hours.   Messages left on nurses line are checked Monday through Friday between the hours of 8:00 AM and 4:00 PM. Messages left after 4:00 pm will be returned the following business day.     Evening (after 4:00 PM), weekends, and holiday on-call: [phone]   For urgent needs after hours, please ask for the oncologist on-call to be paged.   For urgent needs during business hours, please ask for Sherrilyn Rist, Dr. Modena Slater and Delton Coombes nurse coordinators, to be paged.     Notes:   - Allow one business week for our office to complete any requested paperwork (FMLA, etc.)   - Allow two to three business days for all medication refills. Please call your pharmacy first to check for available refills.

## 2018-09-02 ENCOUNTER — Encounter: Admit: 2018-09-02 | Discharge: 2018-09-02

## 2018-09-02 DIAGNOSIS — I10 Essential (primary) hypertension: Secondary | ICD-10-CM

## 2018-09-02 DIAGNOSIS — R079 Chest pain, unspecified: Secondary | ICD-10-CM

## 2018-09-02 DIAGNOSIS — M48061 Spinal stenosis, lumbar region without neurogenic claudication: Secondary | ICD-10-CM

## 2018-09-02 DIAGNOSIS — C8590 Non-Hodgkin lymphoma, unspecified, unspecified site: Secondary | ICD-10-CM
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
## 2018-09-04 ENCOUNTER — Encounter: Admit: 2018-09-04 | Discharge: 2018-09-04

## 2018-09-04 NOTE — Progress Notes
Retrospective Drug Utilization review received and reviewed by Dr. Braun. No changes to prescribed medications at this time.

## 2018-09-08 ENCOUNTER — Encounter: Admit: 2018-09-08 | Discharge: 2018-09-08

## 2018-09-08 DIAGNOSIS — C8332 Diffuse large B-cell lymphoma, intrathoracic lymph nodes: Secondary | ICD-10-CM

## 2018-09-08 LAB — CBC AND DIFF
Lab: 1 % (ref >60)
Lab: 1 % — ABNORMAL LOW (ref 0–10)
Lab: 134 K/UL — ABNORMAL LOW (ref 150–400)
Lab: 15 % — ABNORMAL HIGH (ref >60)
Lab: 2 %
Lab: 2.6 M/UL — ABNORMAL LOW (ref 4.0–5.0)
Lab: 22 % — ABNORMAL HIGH (ref 11–15)
Lab: 23 % — ABNORMAL LOW (ref 36–45)
Lab: 28 pg (ref 26–34)
Lab: 32 g/dL (ref 32.0–36.0)
Lab: 5 % — ABNORMAL LOW (ref 24–44)
Lab: 5.6 K/UL (ref 4.5–11.0)
Lab: 7.7 g/dL — ABNORMAL LOW (ref 12.0–15.0)
Lab: 76 % (ref 41–77)
Lab: 88 FL (ref 80–100)
Lab: 9.2 FL (ref 7–11)

## 2018-09-08 LAB — COMPREHENSIVE METABOLIC PANEL
Lab: 138 MMOL/L — ABNORMAL LOW (ref 137–147)
Lab: 4 MMOL/L (ref 3.5–5.1)

## 2018-09-15 ENCOUNTER — Encounter: Admit: 2018-09-15 | Discharge: 2018-09-15

## 2018-09-15 ENCOUNTER — Encounter: Admit: 2018-09-15 | Discharge: 2018-09-15 | Payer: MEDICARE

## 2018-09-15 DIAGNOSIS — G629 Polyneuropathy, unspecified: Secondary | ICD-10-CM

## 2018-09-15 DIAGNOSIS — C8332 Diffuse large B-cell lymphoma, intrathoracic lymph nodes: Secondary | ICD-10-CM

## 2018-09-15 DIAGNOSIS — C8590 Non-Hodgkin lymphoma, unspecified, unspecified site: Secondary | ICD-10-CM

## 2018-09-15 DIAGNOSIS — G894 Chronic pain syndrome: Secondary | ICD-10-CM

## 2018-09-15 DIAGNOSIS — C8339 Diffuse large B-cell lymphoma, extranodal and solid organ sites: Secondary | ICD-10-CM

## 2018-09-15 DIAGNOSIS — M48061 Spinal stenosis, lumbar region without neurogenic claudication: Secondary | ICD-10-CM

## 2018-09-15 DIAGNOSIS — R5383 Other fatigue: Secondary | ICD-10-CM

## 2018-09-15 DIAGNOSIS — Z452 Encounter for adjustment and management of vascular access device: Secondary | ICD-10-CM

## 2018-09-15 DIAGNOSIS — C8338 Diffuse large B-cell lymphoma, lymph nodes of multiple sites: Secondary | ICD-10-CM

## 2018-09-15 DIAGNOSIS — J9859 Other diseases of mediastinum, not elsewhere classified: Secondary | ICD-10-CM

## 2018-09-15 DIAGNOSIS — I10 Essential (primary) hypertension: Secondary | ICD-10-CM

## 2018-09-15 DIAGNOSIS — R079 Chest pain, unspecified: Secondary | ICD-10-CM

## 2018-09-15 MED ORDER — ACETAMINOPHEN 325 MG PO TAB
650 mg | Freq: Once | ORAL | 0 refills | Status: CP
Start: 2018-09-15 — End: ?

## 2018-09-15 MED ORDER — ONDANSETRON HCL 8 MG PO TAB
16 mg | Freq: Once | ORAL | 0 refills | Status: CP
Start: 2018-09-15 — End: ?
  Administered 2018-09-15: 15:00:00 16 mg via ORAL

## 2018-09-15 MED ORDER — CYCLOPHOSPHAMIDE IVPB
750 mg/m2 | Freq: Once | INTRAVENOUS | 0 refills | Status: CP
Start: 2018-09-15 — End: ?
  Administered 2018-09-15 (×2): 1300 mg via INTRAVENOUS

## 2018-09-15 MED ORDER — OXYCODONE-ACETAMINOPHEN 7.5-325 MG PO TAB
1 | ORAL_TABLET | ORAL | 0 refills | 2.00000 days | Status: DC | PRN
Start: 2018-09-15 — End: 2018-10-20

## 2018-09-15 MED ORDER — VINCRISTINE IVPB
2 mg | Freq: Once | INTRAVENOUS | 0 refills | Status: CP
Start: 2018-09-15 — End: ?
  Administered 2018-09-15: 18:00:00 2 mg via INTRAVENOUS

## 2018-09-15 MED ORDER — DIPHENHYDRAMINE HCL 25 MG PO CAP
25 mg | Freq: Once | ORAL | 0 refills | Status: CP
Start: 2018-09-15 — End: ?
  Administered 2018-09-15: 15:00:00 25 mg via ORAL

## 2018-09-15 MED ORDER — DOXORUBICIN 2 MG/ML IV SOLN
50 mg/m2 | Freq: Once | INTRAVENOUS | 0 refills | Status: CP
Start: 2018-09-15 — End: ?
  Administered 2018-09-15: 17:00:00 83.5 mg via INTRAVENOUS

## 2018-09-15 MED ORDER — RITUXIMAB-PVVR IVPB (SPEC VOL)
375 mg/m2 | Freq: Once | INTRAVENOUS | 0 refills | Status: CP
Start: 2018-09-15 — End: ?
  Administered 2018-09-15 (×2): 650 mg via INTRAVENOUS
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
## 2018-09-15 NOTE — Progress Notes
CHEMO NOTE  Verified chemo consent signed and in chart.    Verified initiate chemo order in O2    Blood return positive via: PICC    BSA and dose double checked (agree with orders as written) with: yes     Labs/applicable tests checked: CBC and Comprehensive Metabolic Panel (CMP)    Chemo regime: Drug/cycle/day: day 1, cycle 5  rituximab-pvvr (RUXIENCE) 650 mg in sodium chloride 0.9% (NS) 250 mL IVPB (rapid infusion)  DOXOrubicin (ADRIAMYCIN) injection 83.5 mg   vinCRIStine (ONCOVIN) 2 mg in sodium chloride 0.9% (NS) 50 mL IVPB  cyclophosphamide (CYTOXAN) 1,300 mg in sodium chloride 0.9% (NS) 315 mL IVPB         Rate verified and armband double checkwith second RN: yes    Patient education offered and stated understanding. Denies questions at this time. Pt has no concerns at this time. Pt met parameters. Pt given pre meds as order. Pt tolerated infusion with no adverse reaction. Pt left clinic ambulatory with all belongings.

## 2018-09-15 NOTE — Telephone Encounter
Patient requesting refill of oxycodone 7.5/325mg    LOV: 07/20/2018  UOV: 10/19/2018  Last filled 08/18/2018

## 2018-09-17 ENCOUNTER — Encounter: Admit: 2018-09-17 | Discharge: 2018-09-17

## 2018-09-17 DIAGNOSIS — J9859 Other diseases of mediastinum, not elsewhere classified: Secondary | ICD-10-CM

## 2018-09-17 DIAGNOSIS — C8332 Diffuse large B-cell lymphoma, intrathoracic lymph nodes: Principal | ICD-10-CM

## 2018-09-17 MED ORDER — PEGFILGRASTIM-CBQV 6 MG/0.6 ML SC SYRG
6 mg | Freq: Once | SUBCUTANEOUS | 0 refills | Status: CP
Start: 2018-09-17 — End: ?
  Administered 2018-09-17: 20:00:00 6 mg via SUBCUTANEOUS

## 2018-09-17 NOTE — Progress Notes
Patient received pegfilgrastim-cbqv (UDENYCA) syringe 6 mg and tolerated without difficulty.  No pertinent changes since last assessment.

## 2018-09-22 ENCOUNTER — Encounter: Admit: 2018-09-22 | Discharge: 2018-09-22

## 2018-09-22 DIAGNOSIS — C8332 Diffuse large B-cell lymphoma, intrathoracic lymph nodes: Principal | ICD-10-CM

## 2018-09-22 LAB — CBC AND DIFF
Lab: 0.2 K/UL — CL (ref 4.5–11.0)
Lab: 19 % — ABNORMAL HIGH (ref 11–15)
Lab: 2.3 M/UL — ABNORMAL LOW (ref 4.0–5.0)
Lab: 21 % — ABNORMAL LOW (ref 36–45)
Lab: 30 pg (ref 26–34)
Lab: 32 g/dL (ref 32.0–36.0)
Lab: 49 K/UL — ABNORMAL LOW (ref 150–400)
Lab: 7.2 g/dL — ABNORMAL LOW (ref 12.0–15.0)
Lab: 8.2 FL (ref 7–11)
Lab: 92 FL (ref 80–100)

## 2018-09-22 LAB — COMPREHENSIVE METABOLIC PANEL
Lab: 135 MMOL/L — ABNORMAL LOW (ref 137–147)
Lab: 27 MMOL/L (ref 21–30)
Lab: 3.7 MMOL/L (ref 3.5–5.1)
Lab: 7 U/L (ref 7–56)

## 2018-09-23 ENCOUNTER — Encounter: Admit: 2018-09-23 | Discharge: 2018-09-23

## 2018-09-24 ENCOUNTER — Encounter: Admit: 2018-09-24 | Discharge: 2018-09-24

## 2018-09-25 ENCOUNTER — Encounter: Admit: 2018-09-25 | Discharge: 2018-09-25

## 2018-09-25 MED ORDER — VALACYCLOVIR 1 GRAM PO TAB
1000 mg | ORAL_TABLET | ORAL | 0 refills | Status: AC
Start: 2018-09-25 — End: ?

## 2018-09-25 NOTE — Telephone Encounter
Called patient to f/u complaints of rash from yesterday as patient had missed her appointment yesterday.   She said that the area of rash is on her back on her shoulder blades and she now has a few blisters on her right chest.   Two blisters popped up on the right side of her face and one on the left side per patient report.     She states that the pain can be described as a burning pain and they do still 'itch terribly'    Instructed patient to send in more pictures of the new lesions as soon as possible so we can prescribe any necessary treatment.     She was grateful for the call back.

## 2018-09-28 ENCOUNTER — Encounter: Admit: 2018-09-28 | Discharge: 2018-09-28

## 2018-09-29 ENCOUNTER — Encounter: Admit: 2018-09-29 | Discharge: 2018-09-29

## 2018-09-29 DIAGNOSIS — C8332 Diffuse large B-cell lymphoma, intrathoracic lymph nodes: Secondary | ICD-10-CM

## 2018-09-29 LAB — CBC AND DIFF
Lab: 0 % (ref >60)
Lab: 0 10*3/uL (ref 0–0.20)
Lab: 0 10*3/uL (ref 0–0.45)
Lab: 0.3 10*3/uL — ABNORMAL LOW (ref 1.0–4.8)
Lab: 0.7 10*3/uL (ref 0–0.80)
Lab: 1 % (ref >60)
Lab: 108 10*3/uL — ABNORMAL LOW (ref 150–400)
Lab: 17 % — ABNORMAL HIGH (ref 11–15)
Lab: 18 % — ABNORMAL HIGH (ref 4–12)
Lab: 2.1 M/UL — ABNORMAL LOW (ref 4.0–5.0)
Lab: 2.9 10*3/uL (ref 1.8–7.0)
Lab: 20 % — ABNORMAL LOW (ref 36–45)
Lab: 30 pg (ref 26–34)
Lab: 32 g/dL (ref 32.0–36.0)
Lab: 4 10*3/uL — ABNORMAL LOW (ref 4.5–11.0)
Lab: 6 % — ABNORMAL LOW (ref 24–44)
Lab: 6.5 g/dL — ABNORMAL LOW (ref 12.0–15.0)
Lab: 75 % (ref 41–77)
Lab: 9.8 FL (ref 7–11)
Lab: 94 FL (ref 80–100)

## 2018-09-29 LAB — COMPREHENSIVE METABOLIC PANEL
Lab: 138 MMOL/L (ref 137–147)
Lab: 3.7 MMOL/L (ref 3.5–5.1)

## 2018-09-29 NOTE — Progress Notes
Pt came in with a concern for four scabbed areas that she didn't know if related to her lymphoma. She has once hardened red scab on her right radial wrist. A fainted area on her right upper chest and then two scabbed areas on her upper left back. None were hot to the touch or indicating sings of infections. Pt's biggest concern was for her right wrist. She spoke with Dr. Leonides Sake CNC yesterday and they discussed her seeing her local PCP. Pt reported that she didn't know if had to do with her lymphoma or possible staph and didn't know if the PCP could help. Asked pt if she was outside in the environment and been scratching at bug bites. Pt reports that she she does sit outdoors and it could possibly be the case. Discussed with the patient that she should see her PCP since they can also diagnose and treat for staph infections. Dr. Bryna Colander and team are gone for the day, but they would ask for her PCP to help with diagnosing and treatment if needed. Pt to call PCP's office today or tomorrow.

## 2018-10-01 ENCOUNTER — Encounter: Admit: 2018-10-01 | Discharge: 2018-10-01

## 2018-10-01 ENCOUNTER — Encounter: Admit: 2018-10-02 | Discharge: 2018-10-02

## 2018-10-01 DIAGNOSIS — D649 Anemia, unspecified: Principal | ICD-10-CM

## 2018-10-01 DIAGNOSIS — C8332 Diffuse large B-cell lymphoma, intrathoracic lymph nodes: Secondary | ICD-10-CM

## 2018-10-01 DIAGNOSIS — J9859 Other diseases of mediastinum, not elsewhere classified: Secondary | ICD-10-CM

## 2018-10-01 NOTE — Telephone Encounter
Called patient to discuss her labs as her hemoglobin from Tuesday was 6.5.   Patient states that she has been quite lightheaded and having dizziness with standing but just thought she needed to be drinking more.     We have arranged for her to get a blood transfusion tomorrow at [facility] location and have discussed her plan with her for tomorrow.   She was appreciative of the call and for coordinating for tomorrow.     COVID swab will be moved to tomorrow as well since patient has a commute for appointments from [place].

## 2018-10-02 ENCOUNTER — Encounter: Admit: 2018-10-02 | Discharge: 2018-10-02

## 2018-10-02 DIAGNOSIS — J9859 Other diseases of mediastinum, not elsewhere classified: Secondary | ICD-10-CM

## 2018-10-02 DIAGNOSIS — C8332 Diffuse large B-cell lymphoma, intrathoracic lymph nodes: Secondary | ICD-10-CM

## 2018-10-02 DIAGNOSIS — D649 Anemia, unspecified: Principal | ICD-10-CM

## 2018-10-02 DIAGNOSIS — Z1159 Encounter for screening for other viral diseases: Secondary | ICD-10-CM

## 2018-10-02 MED ORDER — DIPHENHYDRAMINE HCL 25 MG PO CAP
25 mg | Freq: Once | ORAL | 0 refills | Status: CP
Start: 2018-10-02 — End: ?
  Administered 2018-10-02: 18:00:00 25 mg via ORAL

## 2018-10-02 MED ORDER — ACETAMINOPHEN 325 MG PO TAB
650 mg | Freq: Once | ORAL | 0 refills | Status: CP
Start: 2018-10-02 — End: ?
  Administered 2018-10-02: 18:00:00 650 mg via ORAL

## 2018-10-02 NOTE — Progress Notes
Pt arrived ambulatory, labs drawn from PICC as ordered.   PICC dressing c/d/i.   Pt left ambulatory in good condition.

## 2018-10-02 NOTE — Progress Notes
Patient arrived to [facility] for COVID-19 testing 10/02/18 1042. Patient identity confirmed via photo I.D. Nasopharyngeal procedure explained to the patient.   Nasopharyngeal swab completed right  Patient education provided given and instructed patient self isolate until contacted w/ results and further instructions. CDC handout on COVID-19 given to patient.   [email or web address]    Swab collected by Burney Gauze, PCA.    Date symptoms began/reason for testing: PRE CHEMO COVID, [place]OFF[place]

## 2018-10-02 NOTE — Progress Notes
blood transfusion complete, pt denies blood transfusion reaction, none noted, out of unit ambulatory

## 2018-10-03 ENCOUNTER — Encounter: Admit: 2018-10-03 | Discharge: 2018-10-03

## 2018-10-03 LAB — COVID-19 (SARS-COV-2) PCR

## 2018-10-06 ENCOUNTER — Encounter: Admit: 2018-10-06 | Discharge: 2018-10-06

## 2018-10-06 DIAGNOSIS — G629 Polyneuropathy, unspecified: Secondary | ICD-10-CM

## 2018-10-06 DIAGNOSIS — C8338 Diffuse large B-cell lymphoma, lymph nodes of multiple sites: Secondary | ICD-10-CM

## 2018-10-06 DIAGNOSIS — G894 Chronic pain syndrome: Secondary | ICD-10-CM

## 2018-10-06 DIAGNOSIS — J9859 Other diseases of mediastinum, not elsewhere classified: Secondary | ICD-10-CM

## 2018-10-06 DIAGNOSIS — C8332 Diffuse large B-cell lymphoma, intrathoracic lymph nodes: Secondary | ICD-10-CM

## 2018-10-06 DIAGNOSIS — M48061 Spinal stenosis, lumbar region without neurogenic claudication: Secondary | ICD-10-CM

## 2018-10-06 DIAGNOSIS — C8590 Non-Hodgkin lymphoma, unspecified, unspecified site: Secondary | ICD-10-CM

## 2018-10-06 DIAGNOSIS — R079 Chest pain, unspecified: Secondary | ICD-10-CM

## 2018-10-06 DIAGNOSIS — I10 Essential (primary) hypertension: Secondary | ICD-10-CM

## 2018-10-06 LAB — CBC AND DIFF
Lab: 0 10*3/uL (ref 0–0.20)
Lab: 0 10*3/uL (ref 0–0.45)
Lab: 0.3 10*3/uL — ABNORMAL LOW (ref 1.0–4.8)
Lab: 0.7 10*3/uL (ref 0–0.80)
Lab: 1 % (ref 0–5)
Lab: 1 % (ref >60)
Lab: 10 g/dL — ABNORMAL LOW (ref 12.0–15.0)
Lab: 18 % — ABNORMAL HIGH (ref 4–12)
Lab: 184 K/UL (ref 150–400)
Lab: 2.8 10*3/uL (ref >60)
Lab: 29 % — ABNORMAL LOW (ref 36–45)
Lab: 3.1 M/UL — ABNORMAL LOW (ref 4.0–5.0)
Lab: 3.8 K/UL — ABNORMAL LOW (ref 4.5–11.0)
Lab: 31 pg (ref 26–34)
Lab: 33 g/dL (ref 32.0–36.0)
Lab: 7 % — ABNORMAL LOW (ref 24–44)
Lab: 73 % (ref 41–77)
Lab: 9 FL (ref 7–11)
Lab: 93 FL (ref 80–100)

## 2018-10-06 LAB — COMPREHENSIVE METABOLIC PANEL: Lab: 140 MMOL/L (ref 137–147)

## 2018-10-06 MED ORDER — PREDNISONE 50 MG PO TAB
100 mg | Freq: Once | ORAL | 0 refills | Status: CP
Start: 2018-10-06 — End: ?
  Administered 2018-10-06: 17:00:00 100 mg via ORAL

## 2018-10-06 MED ORDER — ACETAMINOPHEN 325 MG PO TAB
650 mg | Freq: Once | ORAL | 0 refills | Status: CP
Start: 2018-10-06 — End: ?
  Administered 2018-10-06: 17:00:00 650 mg via ORAL

## 2018-10-06 MED ORDER — DIPHENHYDRAMINE HCL 25 MG PO CAP
25 mg | Freq: Once | ORAL | 0 refills | Status: CP
Start: 2018-10-06 — End: ?
  Administered 2018-10-06: 17:00:00 25 mg via ORAL

## 2018-10-06 MED ORDER — RITUXIMAB-PVVR IVPB (SPEC VOL)
375 mg/m2 | Freq: Once | INTRAVENOUS | 0 refills | Status: CP
Start: 2018-10-06 — End: ?
  Administered 2018-10-06 (×3): 650 mg via INTRAVENOUS

## 2018-10-06 MED ORDER — ONDANSETRON HCL 8 MG PO TAB
16 mg | Freq: Once | ORAL | 0 refills | Status: CP
Start: 2018-10-06 — End: ?
  Administered 2018-10-06: 17:00:00 16 mg via ORAL

## 2018-10-06 MED ORDER — DOXORUBICIN 2 MG/ML IV SOLN
50 mg/m2 | Freq: Once | INTRAVENOUS | 0 refills | Status: CP
Start: 2018-10-06 — End: ?
  Administered 2018-10-06: 19:00:00 83.5 mg via INTRAVENOUS

## 2018-10-06 MED ORDER — VINCRISTINE IVPB
2 mg | Freq: Once | INTRAVENOUS | 0 refills | Status: CP
Start: 2018-10-06 — End: ?
  Administered 2018-10-06 (×2): 2 mg via INTRAVENOUS

## 2018-10-06 MED ORDER — CYCLOPHOSPHAMIDE IVPB
750 mg/m2 | Freq: Once | INTRAVENOUS | 0 refills | Status: CP
Start: 2018-10-06 — End: ?
  Administered 2018-10-06 (×3): 1300 mg via INTRAVENOUS

## 2018-10-06 NOTE — Progress Notes
CHEMO NOTE  Verified chemo consent signed and in chart.    Verified initiate chemo order in O2    Blood return positive via: PICC    BSA and dose double checked (agree with orders as written) with: yes     Labs/applicable tests checked: CBC, Comprehensive Metabolic Panel (CMP) and EF    Chemo regime: Drug/cycle/day  C6DD1  rituximab-pvvr (RUXIENCE) 650 mg in sodium chloride 0.9% (NS) 250 mL IVPB (rapid infusion)    DOXOrubicin (ADRIAMYCIN) injection 83.5 mg      vinCRIStine (ONCOVIN) 2 mg in sodium chloride 0.9% (NS) 50 mL IVPB     cyclophosphamide (CYTOXAN) 1,300 mg in sodium chloride 0.9% (NS) 315 mL IVPB      Rate verified and armband double checkwith second RN: yes    Patient education offered and stated understanding. Denies questions at this time.    Patient saw Dr. Bryna Colander today in treatment.  Patient forgot home steroid dose today, RN spoke with Dr. Bryna Colander, TORB okay to give 100mg  prednisone prior to treatment.  Treatment initiated and completed as ordered without incidence.  Per Dr. Leonides Sake verbal order, PICC line removed after treatment complete.  Tip intact, pressure applied for >5 minutes.  Patient left treatment ambulatory, alert and oriented.
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
## 2018-10-08 ENCOUNTER — Encounter: Admit: 2018-10-08 | Discharge: 2018-10-08

## 2018-10-08 DIAGNOSIS — C8332 Diffuse large B-cell lymphoma, intrathoracic lymph nodes: Principal | ICD-10-CM

## 2018-10-08 DIAGNOSIS — J9859 Other diseases of mediastinum, not elsewhere classified: Secondary | ICD-10-CM

## 2018-10-08 MED ORDER — PEGFILGRASTIM-CBQV 6 MG/0.6 ML SC SYRG
6 mg | Freq: Once | SUBCUTANEOUS | 0 refills | Status: CP
Start: 2018-10-08 — End: ?
  Administered 2018-10-08: 18:00:00 6 mg via SUBCUTANEOUS

## 2018-10-08 NOTE — Progress Notes
Patient arrived to CC treatment for injection. Udenyca given and tolerated without difficulty. No pertinent changes since last assessment. All questions and concerns addressed. Patient left CC treatment in stable condition.

## 2018-10-14 ENCOUNTER — Inpatient Hospital Stay: Admit: 2018-10-14 | Discharge: 2018-10-14 | Payer: MEDICARE

## 2018-10-14 ENCOUNTER — Encounter: Admit: 2018-10-14 | Discharge: 2018-10-14 | Payer: MEDICARE

## 2018-10-14 MED ORDER — CEFEPIME 2G/100ML NS IVPB (MB+)
2 g | INTRAVENOUS | 0 refills | Status: DC
Start: 2018-10-14 — End: 2018-10-15
  Administered 2018-10-15 (×4): 2 g via INTRAVENOUS

## 2018-10-14 MED ORDER — GABAPENTIN 300 MG PO CAP
600 mg | Freq: Three times a day (TID) | ORAL | 0 refills | Status: DC
Start: 2018-10-14 — End: 2018-10-20
  Administered 2018-10-15 – 2018-10-20 (×17): 600 mg via ORAL

## 2018-10-14 MED ORDER — VANCOMYCIN PHARMACY TO MANAGE
1 | 0 refills | Status: DC
Start: 2018-10-14 — End: 2018-10-17

## 2018-10-14 MED ORDER — ACYCLOVIR 800 MG PO TAB
800 mg | Freq: Two times a day (BID) | ORAL | 0 refills | Status: DC
Start: 2018-10-14 — End: 2018-10-20
  Administered 2018-10-15 – 2018-10-20 (×12): 800 mg via ORAL

## 2018-10-14 MED ORDER — LACTATED RINGERS IV SOLP
1000 mL | Freq: Once | INTRAVENOUS | 0 refills | Status: CP
Start: 2018-10-14 — End: ?
  Administered 2018-10-15: 06:00:00 1000 mL via INTRAVENOUS

## 2018-10-14 MED ORDER — OXYCODONE 5 MG PO TAB
5 mg | ORAL | 0 refills | Status: DC | PRN
Start: 2018-10-14 — End: 2018-10-20
  Administered 2018-10-15 – 2018-10-20 (×8): 5 mg via ORAL

## 2018-10-14 MED ORDER — VANCOMYCIN 1G/250ML D5W IVPB (VIAL2BAG)
15 mg/kg | Freq: Two times a day (BID) | INTRAVENOUS | 0 refills | Status: DC
Start: 2018-10-14 — End: 2018-10-15

## 2018-10-14 MED ORDER — CYCLOBENZAPRINE 10 MG PO TAB
10 mg | Freq: Every evening | ORAL | 0 refills | Status: DC
Start: 2018-10-14 — End: 2018-10-20
  Administered 2018-10-15 – 2018-10-20 (×6): 10 mg via ORAL

## 2018-10-14 MED ORDER — DIPHENHYDRAMINE(#)/LIDOCAINE/ANTACID 1:1:1 PO SUSP
10 mL | ORAL | 0 refills | Status: DC | PRN
Start: 2018-10-14 — End: 2018-10-20

## 2018-10-14 MED ORDER — SODIUM CHLORIDE 0.9 % IR SOLN
ORAL | 0 refills | Status: DC | PRN
Start: 2018-10-14 — End: 2018-10-20

## 2018-10-14 MED ORDER — ESCITALOPRAM OXALATE 10 MG PO TAB
10 mg | Freq: Every day | ORAL | 0 refills | Status: DC
Start: 2018-10-14 — End: 2018-10-20
  Administered 2018-10-15 – 2018-10-20 (×6): 10 mg via ORAL

## 2018-10-15 ENCOUNTER — Inpatient Hospital Stay
Admit: 2018-10-15 | Discharge: 2018-10-20 | Disposition: A | Discharge status: Home / Self Care | Payer: MEDICARE | Source: Other Acute Inpatient Hospital | Attending: Hematology & Oncology | Admitting: Hematology & Oncology

## 2018-10-15 ENCOUNTER — Inpatient Hospital Stay: Admit: 2018-10-15 | Discharge: 2018-10-15 | Payer: MEDICARE

## 2018-10-15 ENCOUNTER — Encounter: Admit: 2018-10-15 | Discharge: 2018-10-15 | Payer: MEDICARE

## 2018-10-15 LAB — CBC
Lab: 0.6 10*3/uL — CL (ref 4.5–11.0)
Lab: 12 FL — ABNORMAL HIGH (ref 7–11)
Lab: 15 % — ABNORMAL HIGH (ref 11–15)
Lab: 2.2 M/UL — ABNORMAL LOW (ref 4.0–5.0)
Lab: 31 pg (ref 26–34)
Lab: 33 10*3/uL — ABNORMAL LOW (ref 150–400)
Lab: 35 g/dL (ref 32.0–36.0)
Lab: 7.2 g/dL — ABNORMAL LOW (ref 12.0–15.0)

## 2018-10-15 LAB — COVID-19 (SARS-COV-2) PCR

## 2018-10-15 LAB — CBC AND DIFF CELLULAR THERAPEUTICS
Lab: 0.2 10*3/uL — CL (ref 4.5–11.0)
Lab: 0.2 K/UL — CL (ref <100)

## 2018-10-15 LAB — GRAM STAIN: Lab: <2

## 2018-10-15 LAB — LACTIC ACID (BG - RAPID LACTATE)
Lab: 1.2 MMOL/L (ref 0.5–2.0)
Lab: 1.1 MMOL/L (ref 0.5–2.0)

## 2018-10-15 LAB — URINALYSIS DIPSTICK
Lab: NEGATIVE mL/min
Lab: NEGATIVE mL/min
Lab: NEGATIVE

## 2018-10-15 LAB — MAGNESIUM  CELLULAR THERAPEUTICS: Lab: 1.6 mg/dL — ABNORMAL LOW (ref 1.6–2.6)

## 2018-10-15 LAB — ASPERGILLUS (GALACTOMANNAN) AG: Lab: 0

## 2018-10-15 LAB — COMPREHENSIVE METABOLIC PANEL CELLULAR THERAPEUTICS
Lab: 130 MMOL/L — ABNORMAL LOW (ref 137–147)
Lab: 132 MMOL/L — ABNORMAL LOW (ref >60)
Lab: 3.4 MMOL/L — ABNORMAL LOW (ref >60)

## 2018-10-15 LAB — VANCOMYCIN TIMED LEVEL: Lab: 4.6 ug/mL

## 2018-10-15 LAB — FUNGITELL: Lab: <31

## 2018-10-15 LAB — URINALYSIS, MICROSCOPIC

## 2018-10-15 LAB — C REACTIVE PROTEIN (CRP): Lab: 18 mg/dL — ABNORMAL HIGH (ref <1.0)

## 2018-10-15 LAB — PROCALCITONIN: Lab: 1.2 ng/mL — ABNORMAL HIGH (ref <0.11)

## 2018-10-15 LAB — SED RATE: Lab: 25 mm/h — ABNORMAL HIGH (ref 0–20)

## 2018-10-15 LAB — CREATINE KINASE-CPK: Lab: <10 U/L — ABNORMAL LOW (ref 21–215)

## 2018-10-15 LAB — PROTIME INR (PT): Lab: 1.5 M/UL — ABNORMAL HIGH (ref 0.8–1.2)

## 2018-10-15 LAB — PHOSPHORUS  CELLULAR THERAPEUTICS: Lab: 1.9 mg/dL — ABNORMAL LOW (ref 2.0–4.5)

## 2018-10-15 MED ORDER — LACTATED RINGERS IV SOLP
1000 mL | Freq: Once | INTRAVENOUS | 0 refills | Status: CP
Start: 2018-10-15 — End: ?
  Administered 2018-10-15: 15:00:00 1000 mL via INTRAVENOUS

## 2018-10-15 MED ORDER — IOHEXOL 350 MG IODINE/ML IV SOLN
100 mL | Freq: Once | INTRAVENOUS | 0 refills | Status: CP
Start: 2018-10-15 — End: ?
  Administered 2018-10-15: 19:00:00 100 mL via INTRAVENOUS

## 2018-10-15 MED ORDER — POTASSIUM CHLORIDE 20 MEQ PO TBTQ
60 meq | Freq: Once | ORAL | 0 refills | Status: CP
Start: 2018-10-15 — End: ?
  Administered 2018-10-15: 11:00:00 60 meq via ORAL

## 2018-10-15 MED ORDER — LACTATED RINGERS IV SOLP
INTRAVENOUS | 0 refills | Status: DC
Start: 2018-10-15 — End: 2018-10-15

## 2018-10-15 MED ORDER — LACTATED RINGERS IV SOLP
1000 mL | INTRAVENOUS | 0 refills | Status: CP
Start: 2018-10-15 — End: ?
  Administered 2018-10-15: 19:00:00 1000 mL via INTRAVENOUS

## 2018-10-15 MED ORDER — MEROPENEM IVPB
2 g | Freq: Once | INTRAVENOUS | 0 refills | Status: CP
Start: 2018-10-15 — End: ?
  Administered 2018-10-15 (×2): 2 g via INTRAVENOUS

## 2018-10-15 MED ORDER — HYDROCORTISONE SOD SUCC (PF) 100 MG/2 ML IJ SOLR
50 mg | INTRAVENOUS | 0 refills | Status: DC
Start: 2018-10-15 — End: 2018-10-16
  Administered 2018-10-15 – 2018-10-16 (×5): 50 mg via INTRAVENOUS

## 2018-10-15 MED ORDER — POTASSIUM PHOSPHATE, MONOBASIC 500 MG PO TBSO
2 | Freq: Once | ORAL | 0 refills | Status: CP
Start: 2018-10-15 — End: ?
  Administered 2018-10-15: 11:00:00 2 via ORAL

## 2018-10-15 MED ORDER — MEROPENEM 2G/140ML NS IVPB (EXTENDED INFUSION)
2 g | INTRAVENOUS | 0 refills | Status: DC
Start: 2018-10-15 — End: 2018-10-17
  Administered 2018-10-15 – 2018-10-17 (×12): 2 g via INTRAVENOUS

## 2018-10-15 MED ORDER — CEFEPIME 2G/100ML NS IVPB (MB+)(EXTENDED INFUSION)
2 g | INTRAVENOUS | 0 refills | Status: DC
Start: 2018-10-15 — End: 2018-10-18
  Administered 2018-10-15 – 2018-10-18 (×20): 2 g via INTRAVENOUS

## 2018-10-15 MED ORDER — VANCOMYCIN 1G/250ML D5W IVPB (VIAL2BAG)
15 mg/kg | Freq: Two times a day (BID) | INTRAVENOUS | 0 refills | Status: DC
Start: 2018-10-15 — End: 2018-10-16
  Administered 2018-10-15 – 2018-10-16 (×4): 1000 mg via INTRAVENOUS

## 2018-10-15 MED ORDER — LACTATED RINGERS IV SOLP
INTRAVENOUS | 0 refills | Status: DC
Start: 2018-10-15 — End: 2018-10-16
  Administered 2018-10-15 – 2018-10-16 (×2): 1000.000 mL via INTRAVENOUS

## 2018-10-15 MED ORDER — VANCOMYCIN  750 MG IVPB
750 mg | Freq: Two times a day (BID) | INTRAVENOUS | 0 refills | Status: DC
Start: 2018-10-15 — End: 2018-10-15
  Administered 2018-10-15 (×2): 750 mg via INTRAVENOUS

## 2018-10-15 MED ADMIN — SODIUM CHLORIDE 0.9 % IV SOLP [27838]: 250 mL | INTRAVENOUS | @ 06:00:00 | Stop: 2018-10-15 | NDC 00338004902
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
## 2018-10-16 LAB — COMPREHENSIVE METABOLIC PANEL CELLULAR THERAPEUTICS: Lab: 138 MMOL/L — ABNORMAL HIGH (ref 137–147)

## 2018-10-16 LAB — VANCOMYCIN 2HR POST DOSE: Lab: 12 ug/mL

## 2018-10-16 LAB — CULTURE-URINE W/SENSITIVITY

## 2018-10-16 LAB — VANCOMYCIN TROUGH: Lab: 4.7 ug/mL — ABNORMAL LOW (ref 10.0–20.0)

## 2018-10-16 MED ORDER — HYDROCORTISONE SOD SUCC (PF) 100 MG/2 ML IJ SOLR
50 mg | Freq: Two times a day (BID) | INTRAVENOUS | 0 refills | Status: DC
Start: 2018-10-16 — End: 2018-10-18
  Administered 2018-10-17 – 2018-10-18 (×3): 50 mg via INTRAVENOUS

## 2018-10-16 MED ORDER — VANCOMYCIN 1G/250ML D5W IVPB (VIAL2BAG)
15 mg/kg | INTRAVENOUS | 0 refills | Status: DC
Start: 2018-10-16 — End: 2018-10-17
  Administered 2018-10-16 – 2018-10-17 (×6): 1000 mg via INTRAVENOUS

## 2018-10-16 MED ADMIN — SODIUM CHLORIDE 0.9 % IV SOLP [27838]: 250 mL | INTRAVENOUS | @ 23:00:00 | Stop: 2018-10-16 | NDC 00338004902

## 2018-10-17 LAB — CULTURE-WOUND/TISSUE/FLUID(AEROBIC ONLY)W/SENSITIVITY

## 2018-10-17 LAB — URINALYSIS DIPSTICK
Lab: NEGATIVE
Lab: NEGATIVE
Lab: NEGATIVE
Lab: NEGATIVE

## 2018-10-17 LAB — COMPREHENSIVE METABOLIC PANEL CELLULAR THERAPEUTICS: Lab: 140 MMOL/L — ABNORMAL HIGH (ref 137–147)

## 2018-10-17 LAB — URINE HGB FOR TRANFUSION REACTION

## 2018-10-17 LAB — URINALYSIS, MICROSCOPIC

## 2018-10-17 LAB — MAGNESIUM: Lab: 1.9 mg/dL (ref 1.6–2.6)

## 2018-10-17 LAB — CBC AND DIFF CELLULAR THERAPEUTICS: Lab: 2.7 K/UL — ABNORMAL LOW (ref 4.5–11.0)

## 2018-10-17 MED ORDER — DIPHENHYDRAMINE HCL 50 MG/ML IJ SOLN
50 mg | Freq: Once | INTRAVENOUS | 0 refills | Status: CP
Start: 2018-10-17 — End: ?
  Administered 2018-10-17: 20:00:00 50 mg via INTRAVENOUS

## 2018-10-17 MED ORDER — POTASSIUM CHLORIDE 20 MEQ PO TBTQ
60 meq | Freq: Once | ORAL | 0 refills | Status: CP
Start: 2018-10-17 — End: ?
  Administered 2018-10-17: 23:00:00 60 meq via ORAL

## 2018-10-17 MED ORDER — POTASSIUM CHLORIDE 20 MEQ PO TBTQ
60 meq | Freq: Once | ORAL | 0 refills | Status: CP
Start: 2018-10-17 — End: ?
  Administered 2018-10-17: 17:00:00 60 meq via ORAL

## 2018-10-17 MED ORDER — OXYMETAZOLINE 0.05 % NA SPRY
2 | Freq: Once | NASAL | 0 refills | Status: CP
Start: 2018-10-17 — End: ?
  Administered 2018-10-17: 12:00:00 2 via NASAL

## 2018-10-18 ENCOUNTER — Encounter: Admit: 2018-10-18 | Discharge: 2018-10-18 | Payer: MEDICARE

## 2018-10-18 LAB — CULTURE-WOUND/TISSUE/FLUID(AEROBIC ONLY)W/SENSITIVITY

## 2018-10-18 LAB — CBC AND DIFF CELLULAR THERAPEUTICS: Lab: 5.5 K/UL — ABNORMAL LOW (ref >60)

## 2018-10-18 LAB — COMPREHENSIVE METABOLIC PANEL CELLULAR THERAPEUTICS: Lab: 139 MMOL/L — ABNORMAL LOW (ref 137–147)

## 2018-10-18 MED ORDER — CEFAZOLIN INJ 1GM IVP
2 g | INTRAVENOUS | 0 refills | Status: DC
Start: 2018-10-18 — End: 2018-10-20
  Administered 2018-10-19 – 2018-10-20 (×5): 2 g via INTRAVENOUS

## 2018-10-18 MED ADMIN — SODIUM CHLORIDE 0.9 % IV SOLP [27838]: 250 mL | INTRAVENOUS | @ 05:00:00 | Stop: 2018-10-18 | NDC 00338004902

## 2018-10-19 ENCOUNTER — Inpatient Hospital Stay: Admit: 2018-10-19 | Discharge: 2018-10-19 | Payer: MEDICARE

## 2018-10-19 ENCOUNTER — Encounter: Admit: 2018-10-19 | Discharge: 2018-10-19 | Payer: MEDICARE

## 2018-10-19 LAB — CBC AND DIFF CELLULAR THERAPEUTICS: Lab: 4.1 K/UL — ABNORMAL LOW (ref 4.5–11.0)

## 2018-10-19 LAB — COMPREHENSIVE METABOLIC PANEL CELLULAR THERAPEUTICS: Lab: 141 MMOL/L — ABNORMAL HIGH (ref >60)

## 2018-10-19 MED ORDER — EUCALYPTUS-MENTHOL MM LOZG
1 | ORAL | 0 refills | Status: DC | PRN
Start: 2018-10-19 — End: 2018-10-20
  Administered 2018-10-19: 21:00:00 1 via ORAL

## 2018-10-19 MED ORDER — POTASSIUM CHLORIDE 20 MEQ PO TBTQ
40 meq | Freq: Once | ORAL | 0 refills | Status: CP
Start: 2018-10-19 — End: ?
  Administered 2018-10-19: 14:00:00 40 meq via ORAL

## 2018-10-19 MED ORDER — PERFLUTREN LIPID MICROSPHERES 1.1 MG/ML IV SUSP
1-20 mL | Freq: Once | INTRAVENOUS | 0 refills | Status: CP | PRN
Start: 2018-10-19 — End: ?
  Administered 2018-10-19: 19:00:00 2.5 mL via INTRAVENOUS

## 2018-10-20 ENCOUNTER — Encounter: Admit: 2018-10-20 | Discharge: 2018-10-20 | Payer: MEDICARE

## 2018-10-20 LAB — CBC AND DIFF CELLULAR THERAPEUTICS: Lab: 4.7 K/UL — ABNORMAL LOW (ref >60)

## 2018-10-20 LAB — COMPREHENSIVE METABOLIC PANEL CELLULAR THERAPEUTICS
Lab: 0.4 mg/dL — ABNORMAL LOW (ref >60)
Lab: 139 MMOL/L — ABNORMAL LOW (ref 137–147)

## 2018-10-20 MED ORDER — DAPTOMYCIN SYR
Freq: Once | 0 refills | Status: CN
Start: 2018-10-20 — End: ?

## 2018-10-20 MED ORDER — DAPTOMYCIN SYR
6 mg/kg | Freq: Once | INTRAVENOUS | 0 refills | Status: CP
Start: 2018-10-20 — End: ?
  Administered 2018-10-20: 16:00:00 350 mg via INTRAVENOUS

## 2018-10-20 MED ORDER — OXYCODONE-ACETAMINOPHEN 7.5-325 MG PO TAB
1 | ORAL_TABLET | ORAL | 0 refills | 2.00000 days | Status: DC | PRN
Start: 2018-10-20 — End: 2018-10-30

## 2018-10-20 MED ORDER — DAPTOMYCIN SYR
6 mg/kg | INTRAVENOUS | 0 refills | 7.00000 days | Status: DC
Start: 2018-10-20 — End: 2018-10-20

## 2018-10-20 MED ORDER — DAPTOMYCIN SYR
Freq: Every day | INTRAVENOUS | 0 refills | 7.00000 days | Status: AC
Start: 2018-10-20 — End: ?

## 2018-10-20 MED ORDER — DAPTOMYCIN 350 MG IV SOLR
6 mg/kg | Freq: Every day | INTRAVENOUS | 0 refills | Status: CN
Start: 2018-10-20 — End: ?

## 2018-10-20 MED ORDER — DAPTOMYCIN SYR
6 mg/kg | Freq: Once | INTRAVENOUS | 0 refills | Status: DC
Start: 2018-10-20 — End: 2018-10-20

## 2018-10-20 NOTE — Telephone Encounter
Patient requesting refill of oxycodone-acetaminophen 7.5/325mg    LOV: 07/20/2018  UOV: spoke to patient she needs to call and schedule med check appointment. Patient is currently in hospital for low platelets and WC count with oncology.   Last filled 09/17/2018

## 2018-10-21 ENCOUNTER — Encounter: Admit: 2018-10-21 | Discharge: 2018-10-21 | Payer: MEDICARE

## 2018-10-21 LAB — CULTURE-ANAEROBIC

## 2018-10-21 LAB — CULTURE-BLOOD W/SENSITIVITY

## 2018-10-21 NOTE — Telephone Encounter
Infectious Diseases reconciliation note from [facility] discharge on 10/20/18                  Per Dr. Wilburn Cornelia,    1. Daptomycin 350mg  IV Q 24 hrs. Approximate end date 11/14/18.     2. Weekly labs to be drawn every Monday, with start date 10/26/18: CBC w/Diff, CMP, and CPK.     3. Weekly PICC care per protocol.     4. Follow up: in 2 weeks    Confirmed orders with Colletta Maryland at [facility].

## 2018-10-28 ENCOUNTER — Encounter: Admit: 2018-10-28 | Discharge: 2018-10-28 | Payer: MEDICARE

## 2018-10-28 NOTE — Telephone Encounter
Shelby from [facility] calls to report pt missed infusion appt today. Pt stated her dog ran away, then she didn't feel well, so she will cancel today's appt and try her best to make it tomorrow. Pt has not missed any other infusion appts. Wilburn Cornelia will call tomorrow if pt misses again.

## 2018-10-30 ENCOUNTER — Encounter: Admit: 2018-10-30 | Discharge: 2018-10-30 | Payer: MEDICARE

## 2018-10-30 MED ORDER — OXYCODONE-ACETAMINOPHEN 7.5-325 MG PO TAB
1 | ORAL_TABLET | ORAL | 0 refills | 2.00000 days | Status: DC | PRN
Start: 2018-10-30 — End: 2018-12-02

## 2018-10-30 NOTE — Telephone Encounter
Patient called refilled oxycodone 7.5/325mg  at [facility] on 10/21/2018. Was only able to get #21 for a 7 day prescription as considered opoid naive by new insurance. Verified with [facility] patient only received #21 on that date.  Resending new prescription for full 1 month amount. Advised patient to please call back if further issues with full quantity refill and we can do a PA. Patient agreed to plan.

## 2018-11-03 ENCOUNTER — Encounter: Admit: 2018-11-03 | Discharge: 2018-11-03 | Payer: MEDICARE

## 2018-11-04 LAB — COMPREHENSIVE METABOLIC PANEL: Lab: 0.7

## 2018-11-04 LAB — CBC AND DIFF: Lab: 2.2 mg/dL — ABNORMAL HIGH (ref <100)

## 2018-11-04 LAB — CREATINE KINASE-CPK: Lab: 33 ug/dL — ABNORMAL HIGH (ref 270–380)

## 2018-11-04 NOTE — Progress Notes
Infectious Diseases Progress Note    Today's Date:  11/04/2018  Admission Date: (Not on file)  @KULOS @    Reason for this consultation:?Ecthyma gangrenosum?  ?  Assessment:   1. ?Febrile neutropenia.??RESOLVED.  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  3. ?Left elbow pain and swelling - concerning for deep skin/soft tissue infection in the setting of #1 &?#2.   RESOLVED.  4. ?Sepsis.  RESOLVED.  5. 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  6.  Left mid back dermatitis at bra line.  ?  Recommendations:   1. ?Continue daptomycin 6 mg/kg IV daily (for ease of dosing) x4 weeks ending November 14, 2018  2.  Seriel CPK while on daptomycin.  3.??Outpatient parenteral antibiotic therapy as above..  4.  Discontinue PICC AFTER last dose of daptomycin.  5.??Continue acyclovir 800 mg p.o. twice daily (prophylaxis).  6.??Watch for antimicrobial toxicities.  7. ?Antimicrobial ointment to back lesions.  8.  Keep lesion covered with band-aid to limits irritation from bra.  9.  Follow up with infectious disease as needed.  10.  Follow up with oncology as scheduled.  11.  Seasonal influenza vaccination offered - patient declined.  12.  Sutures removed from left upper extremity biopsy sites x2.  ?  Thank you for the consultation.   ?  Gaylyn Cheers, DO  Assistant Professor  Division of Infectious Diseases  ?  History of Present Illness?   Kara Andersen?is a 48 y.o.??WF transferred from?[facility]?with febrile neutropenia.  ?  Denies fevers, chills, night sweats. ?Denies nausea, vomiting, or diarrhea. ?Reports left elbow pain?is improved.??Reports?developing any?new skin lesion on left mid back along bra line.  Previous skin lesions have all resolved.  She was accompanied by her eldest daughter.  ?  Blood culture October 14, 2018?([facility]): No growth  Blood culture October 14, 2018?([facility]): No growth  ? Blood culture October 15, 2018?([facility]): No growth  ?  Left arm hemorrhagic bulla fluid Gram stain October 15, 2018: No neutrophils, many RBCs, moderate GPC resembling staphylococci  Left arm hemorrhagic bulla fluid culture October 15, 2018:?Methicillin susceptible Staphylococcus aureus  ?  Left upper arm biopsy October 16, 2018: MSSA      Antimicrobial Start date End date                                          Medications     Current Outpatient Medications:   ?  acyclovir (ZOVIRAX) 800 mg tablet, Take one tablet by mouth twice daily., Disp: 60 tablet, Rfl: 0  ?  cetirizine (WAL-ZYR (CETIRIZINE)) 10 mg tablet, Take 10 mg by mouth every morning., Disp: , Rfl:   ?  cyclobenzaprine (FLEXERIL) 10 mg tablet, TAKE 1 TABLET BY MOUTH AT BEDTIME, Disp: 90 tablet, Rfl: 0  ?  DAPTOmycin (CUBICIN) 500 mg/10 mL solr, Administer 350mg  (7mL) via IV push over 5 minutes once daily for 25 days. Last dose 11/14/18., Disp: , Rfl:   ?  DIPH/LIDO/ANTACID 1:1:1 (COMPOUND), Swish and Spit 10 mL by mouth as directed every 4 hours as needed., Disp: 240 mL, Rfl: 1  ?  escitalopram oxalate (LEXAPRO) 10 mg tablet, Take 10 mg by mouth daily., Disp: , Rfl:   ?  gabapentin (NEURONTIN) 600 mg tablet, Take one tablet by mouth three times daily., Disp: 90 tablet, Rfl: 11  ?  nitroglycerin (NITROSTAT) 0.4 mg tablet, Place  0.4 mg under tongue every 5 minutes as needed for Chest Pain. Max of 3 tablets, call 911., Disp: , Rfl:   ?  ondansetron (ZOFRAN) 8 mg tablet, Take one tablet by mouth every 8 hours as needed for Nausea or Vomiting., Disp: 40 tablet, Rfl: 1  ?  oxyCODONE-acetaminophen (PERCOCET) 7.5-325 mg tablet, Take one tablet by mouth every 8 hours as needed, Disp: 90 tablet, Rfl: 0  ?  predniSONE (DELTASONE) 50 mg tablet, Take two tablets by mouth daily with breakfast. X 5 days with the start of each chemotherapy cycle every 21 days, Disp: 10 tablet, Rfl: 4      Physical Examination     Vitals:    11/04/18 1105   BP: 129/77   Pulse: 90 Temp: 36.7 ?C (98.1 ?F)   Weight: 58.4 kg (128 lb 12.8 oz)   Height: 162.6 cm (64)   PainSc: Zero       ?  General appearance:?alert, oriented x 3, in NAD  HENT:?normocephalic, atraumatic, no sinus tenderness, oropharynx clear, dentition fair  Eyes: PERRL, EOM grossly intact  Neck: supple, no lymphadenopathy  Heart: regular rhythm, tachycardic rate, no murmur, rub, gallop  Lungs:?lungs clear to ascultation bilaterally, no wheezing, rhonchi, rales appreciated  Abdomen:?soft, non-tender, non-distended, normoactive bowel sounds, no hepatosplenomegaly, no masses  Ext:?no clubbing or cyanosis; (B)LE edema; left elbow without edema. erythema, TTP,  rr pain with active and passive ROM  Skin: hemorraghic macules resolved, sutures (L)UE @ biopsy sites, ~0.5 cm area dermatitis (L) mid back @ bra line  Lymph:?no cervical adenopathy  Neuro:??CN II-XII grossly intact, no focal deficits  ?  Lines: (R)UE PICC  ?  Drains/tubes: None    Lab Review   Hematology  Recent Labs     11/02/18  1152   WBC 2.2   HGB 9.2   PLTCT 94     Chemistry  Recent Labs     11/02/18  1152   K 3.6   BUN 9.0   CR 0.70   ALKPHOS 63   AST 12   ALT 8       Microbiology, Radiology and other Diagnostics Review   @MICRO24 @  Microbiology data reviewed.    Pertinent radiology images viewed.

## 2018-11-10 ENCOUNTER — Encounter: Admit: 2018-11-10 | Discharge: 2018-11-10 | Payer: MEDICARE

## 2018-11-10 DIAGNOSIS — Z792 Long term (current) use of antibiotics: Secondary | ICD-10-CM

## 2018-11-10 NOTE — Telephone Encounter
Infectious Diseases Outpatient Orders:    Today's Date: 11/10/18    Per Dr. Wilburn Cornelia,    1. D/C IV Daptomycin after last dose on 11/14/18    2. D/C PICC line after last dose, call [phone] to confirm line removed    3. D/C weekly labs and picc care after line removed    4. ID f/u prn    Above orders faxed to [facility].

## 2018-11-11 ENCOUNTER — Ambulatory Visit: Admit: 2018-11-11 | Discharge: 2018-11-11 | Payer: MEDICARE

## 2018-11-11 ENCOUNTER — Encounter: Admit: 2018-11-11 | Discharge: 2018-11-11 | Payer: MEDICARE

## 2018-11-11 DIAGNOSIS — C8332 Diffuse large B-cell lymphoma, intrathoracic lymph nodes: Secondary | ICD-10-CM

## 2018-11-11 DIAGNOSIS — Z792 Long term (current) use of antibiotics: Secondary | ICD-10-CM

## 2018-11-11 LAB — CBC AND DIFF
Lab: 0 10*3/uL (ref 0–0.20)
Lab: 0.1 10*3/uL (ref 0–0.45)
Lab: 0.4 10*3/uL — ABNORMAL LOW (ref 1.0–4.8)
Lab: 0.5 10*3/uL (ref 0–0.80)
Lab: 120 10*3/uL — ABNORMAL LOW (ref 150–400)
Lab: 13 % — ABNORMAL LOW (ref 24–44)
Lab: 15 % — ABNORMAL HIGH (ref 4–12)
Lab: 17 % — ABNORMAL HIGH (ref 11–15)
Lab: 2 % (ref >60)
Lab: 2.4 10*3/uL (ref 1.8–7.0)
Lab: 2.9 M/UL — ABNORMAL LOW (ref 4.0–5.0)
Lab: 27 % — ABNORMAL LOW (ref 36–45)
Lab: 3.8 10*3/uL — ABNORMAL LOW (ref 4.5–11.0)
Lab: 32 pg (ref 26–34)
Lab: 34 g/dL (ref 32.0–36.0)
Lab: 5 % (ref >60)
Lab: 65 % (ref 41–77)
Lab: 9.1 FL (ref 7–11)
Lab: 9.7 g/dL — ABNORMAL LOW (ref 12.0–15.0)
Lab: 93 FL (ref 80–100)

## 2018-11-11 LAB — CREATINE KINASE-CPK: Lab: 31 U/L (ref 21–215)

## 2018-11-11 LAB — POC GLUCOSE: Lab: 110 mg/dL — ABNORMAL HIGH (ref 70–100)

## 2018-11-11 LAB — COMPREHENSIVE METABOLIC PANEL
Lab: 140 MMOL/L (ref 137–147)
Lab: 3.6 MMOL/L (ref 3.5–5.1)

## 2018-11-11 MED ORDER — RP DX F-18 FDG MCI
10 | Freq: Once | INTRAVENOUS | 0 refills | Status: CP
Start: 2018-11-11 — End: ?
  Administered 2018-11-11: 21:00:00 9.5 via INTRAVENOUS

## 2018-11-12 ENCOUNTER — Encounter: Admit: 2018-11-12 | Discharge: 2018-11-12 | Payer: MEDICARE

## 2018-11-12 DIAGNOSIS — C8332 Diffuse large B-cell lymphoma, intrathoracic lymph nodes: Secondary | ICD-10-CM

## 2018-11-12 NOTE — Telephone Encounter
Called to patient to ensure that she saw the results of her PET scan and to inquire if she had any questions.     She denies any questions about the scan and was appreciative of the call.     She did ask questions about her PICC line and antibiotics and I read the note from the nurse from the ID team to go over the plan with her She was happy with this information and denies any further concerns.

## 2018-11-16 ENCOUNTER — Encounter: Admit: 2018-11-16 | Discharge: 2018-11-16 | Payer: MEDICARE

## 2018-11-16 LAB — CULTURE-FUNGAL,OTHER

## 2018-11-16 NOTE — Telephone Encounter
Received call from [facility] at [facility]. Pt did not show for her last dose of Daptomycin or picc line removal.  They have called pt and no return call.    Called pt's daughter Kara Andersen, she lives w/ her mother and is able to bring phone to pt. Pt reports that she overslept and missed her appt. She will call them and reschedule today.

## 2018-11-20 ENCOUNTER — Encounter: Admit: 2018-11-20 | Discharge: 2018-11-20 | Payer: MEDICARE

## 2018-11-20 NOTE — Telephone Encounter
Patient called asking for refill on hydrocodone 7.5/325mg . Reviewed previous message with patient about filling #21 and a full prescription was sent on 10/30/2018. Patient then requested Nov refill. Advised that she needed to be scheduled for an office visit. Telehealth appointment was scheduled for 12/03/2018. Advised patient to call back if Oct refill does not cover until her appointment. Patient agreed to plan.

## 2018-12-01 ENCOUNTER — Encounter: Admit: 2018-12-01 | Discharge: 2018-12-01 | Payer: MEDICARE

## 2018-12-01 NOTE — Telephone Encounter
Patient requesting refill of oxycodone 7.5/325mg   LOV: 07/20/2018  UOV: 12/03/2018  Last filled 10/30/2018

## 2018-12-02 MED ORDER — OXYCODONE-ACETAMINOPHEN 7.5-325 MG PO TAB
1 | ORAL_TABLET | ORAL | 0 refills | 2.00000 days | Status: DC | PRN
Start: 2018-12-02 — End: 2019-01-04

## 2018-12-03 ENCOUNTER — Encounter: Admit: 2018-12-03 | Discharge: 2018-12-03 | Payer: MEDICARE

## 2018-12-03 ENCOUNTER — Ambulatory Visit: Admit: 2018-12-03 | Discharge: 2018-12-04 | Payer: MEDICARE

## 2018-12-03 DIAGNOSIS — M48061 Spinal stenosis, lumbar region without neurogenic claudication: Secondary | ICD-10-CM

## 2018-12-03 DIAGNOSIS — C8590 Non-Hodgkin lymphoma, unspecified, unspecified site: Secondary | ICD-10-CM

## 2018-12-03 DIAGNOSIS — C801 Malignant (primary) neoplasm, unspecified: Secondary | ICD-10-CM

## 2018-12-03 DIAGNOSIS — R079 Chest pain, unspecified: Secondary | ICD-10-CM

## 2018-12-03 DIAGNOSIS — I10 Essential (primary) hypertension: Secondary | ICD-10-CM

## 2018-12-04 DIAGNOSIS — M5416 Radiculopathy, lumbar region: Secondary | ICD-10-CM

## 2018-12-04 DIAGNOSIS — M5116 Intervertebral disc disorders with radiculopathy, lumbar region: Principal | ICD-10-CM

## 2018-12-09 ENCOUNTER — Encounter: Admit: 2018-12-09 | Discharge: 2018-12-09 | Payer: MEDICARE

## 2018-12-09 MED ORDER — CYCLOBENZAPRINE 10 MG PO TAB
10 mg | ORAL_TABLET | Freq: Every evening | ORAL | 0 refills | 30.00000 days | Status: DC
Start: 2018-12-09 — End: 2019-02-26

## 2018-12-09 NOTE — Telephone Encounter
Patient requesting refill of flexeril   LOV: 12/03/2018  UOV: 12/23/2018

## 2018-12-11 ENCOUNTER — Encounter: Admit: 2018-12-11 | Discharge: 2018-12-11 | Payer: MEDICARE

## 2018-12-14 ENCOUNTER — Encounter: Admit: 2018-12-14 | Discharge: 2018-12-14 | Payer: MEDICARE

## 2018-12-14 DIAGNOSIS — R079 Chest pain, unspecified: Secondary | ICD-10-CM

## 2018-12-14 DIAGNOSIS — I10 Essential (primary) hypertension: Secondary | ICD-10-CM

## 2018-12-14 DIAGNOSIS — M48061 Spinal stenosis, lumbar region without neurogenic claudication: Secondary | ICD-10-CM

## 2018-12-14 DIAGNOSIS — C8332 Diffuse large B-cell lymphoma, intrathoracic lymph nodes: Secondary | ICD-10-CM

## 2018-12-14 DIAGNOSIS — C801 Malignant (primary) neoplasm, unspecified: Secondary | ICD-10-CM

## 2018-12-14 DIAGNOSIS — D649 Anemia, unspecified: Secondary | ICD-10-CM

## 2018-12-14 DIAGNOSIS — C8590 Non-Hodgkin lymphoma, unspecified, unspecified site: Secondary | ICD-10-CM

## 2018-12-14 LAB — CBC AND DIFF
Lab: 11 g/dL — ABNORMAL LOW (ref 12.0–15.0)
Lab: 3.8 M/UL — ABNORMAL LOW (ref 4.0–5.0)
Lab: 3.9 K/UL — ABNORMAL LOW (ref 4.5–11.0)
Lab: 34 % — ABNORMAL LOW (ref 36–45)
Lab: 91 FL (ref 80–100)

## 2018-12-14 LAB — COMPREHENSIVE METABOLIC PANEL
Lab: 141 MMOL/L (ref 137–147)
Lab: 3.8 MMOL/L (ref 3.5–5.1)

## 2018-12-14 NOTE — Progress Notes
Name: Kara Andersen          MRN: [id number]      DOB: Oct 30, 1970      AGE: 48 y.o.   DATE OF SERVICE: 12/14/2018    Subjective:             Reason for Visit:  Cancer Follow up      Kara Andersen is a 48 y.o. female.     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  She completed 6 cycles of R-CHOP with CR.      Unfortunately she was admitted to the hospital in September after cycle 6 with neutropenia and fever.  She had dark lesions on skin and they were biopsied.  The biopsy grew staph aureus.  She received IV antibiotics and ID was consulted.  She was transfused at the hospital.  Her PICC line was removed.    She is doing well today overall.  Her only complaint is sinus congestion with colored rhinorrhea.  She has been using Mucinex and Sudafed as well as a nasal spray as suggested by her primary doctor last week.  She complains of on and off sinus pain.  She denies allergies.  No fevers.  She has a intermittent cough.  She complains of postnasal drip.  Her taste has improved.  No neuropathy.  No GI or GU complaints.  Continues to have back pain.  She plans to get an epidural injection next week.  No swelling or rashes.  Feels that her energy has improved significantly.     HPI  1.  Presented May 2020 with bulky mediastinal mass.  2.  06/22/2018 core needle biopsy of mediastinal lesion revealed CD10 negative CD30 negative diffuse large B-cell lymphoma.  Both FISH and IHC for MYC were negative. 3.  06/23/2018 PET/CT revealed bulky hypermetabolic mediastinal mass along with lesions in the pleura and liver.  Staging bone marrow was negative.  4.  R-CHOP started May 2020    She is alone.     Review of Systems      Objective:         ? cyclobenzaprine (FLEXERIL) 10 mg tablet Take one tablet by mouth at bedtime daily.   ? escitalopram oxalate (LEXAPRO) 10 mg tablet Take 10 mg by mouth daily.   ? gabapentin (NEURONTIN) 600 mg tablet Take one tablet by mouth three times daily.   ? nitroglycerin (NITROSTAT) 0.4 mg tablet Place 0.4 mg under tongue every 5 minutes as needed for Chest Pain. Max of 3 tablets, call 911.   ? oxyCODONE-acetaminophen (PERCOCET) 7.5-325 mg tablet Take one tablet by mouth every 8 hours as needed     Vitals:    12/14/18 1219   BP: (!) 142/88   BP Source: Arm, Right Upper   Patient Position: Sitting   Pulse: 82   Resp: 16   Temp: 36.4 ?C (97.6 ?F)   TempSrc: Oral   SpO2: 99%   Weight: 61.1 kg (134 lb 12.8 oz)   Height: 162.6 cm (64)   PainSc: Zero     Body mass index is 23.14 kg/m?Marland Kitchen  Pain Score: Zero         Pain Addressed:  N/A  [place] Cooperative Oncology Group performance status is 1, Restricted in physically strenuous activity but ambulatory and able to carry out work of a light or sedentary nature, e.g., light house work, office work.     Physical Exam  Vitals signs reviewed.   Constitutional:       Appearance: Normal appearance.   Skin:     General: Skin is warm and dry.   Neurological:      General: No focal deficit present.      Mental Status: She is alert and oriented to person, place, and time.   Psychiatric:         Mood and Affect: Mood normal.            CBC w/Diff    Lab Results   Component Value Date/Time    WBC 3.9 (L) 12/14/2018 12:03 PM    RBC 3.80 (L) 12/14/2018 12:03 PM    HGB 11.6 (L) 12/14/2018 12:03 PM    HCT 34.8 (L) 12/14/2018 12:03 PM    MCV 91.4 12/14/2018 12:03 PM    MCH 30.6 12/14/2018 12:03 PM    MCHC 33.5 12/14/2018 12:03 PM    RDW 13.3 12/14/2018 12:03 PM PLTCT 185 12/14/2018 12:03 PM    MPV 8.0 12/14/2018 12:03 PM    Lab Results   Component Value Date/Time    NEUT 75 12/14/2018 12:03 PM    ANC 2.90 12/14/2018 12:03 PM    LYMA 11 (L) 12/14/2018 12:03 PM    ALC 0.40 (L) 12/14/2018 12:03 PM    MONA 9 12/14/2018 12:03 PM    AMC 0.40 12/14/2018 12:03 PM    EOSA 4 12/14/2018 12:03 PM    AEC 0.20 12/14/2018 12:03 PM    BASA 1 12/14/2018 12:03 PM    ABC 0.00 12/14/2018 12:03 PM             Assessment and Plan:      ?   1. 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  Her end of treatment PET scan on 11/11/2018 revealed complete remission.  She will follow up with Dr. Mikey Bussing as scheduled URI 2021.  Call in the interim with any problems or concerns.  2. Anemia, improved.  She received blood in the hospital in September after cycle 6.  3. End of treatment topics discussed today.  She has a primary doctor, Dr. Maisie Fus.  She has not had a recent mammogram but she plans to schedule this.  She does not routinely get the flu shot.  We discussed healthy lifestyle behaviors including exercise.  End of treatment summary provided with her today.  Everything was answered to her satisfaction.  4. Sinus and congestion symptoms.  She saw Dr. Maurine Minister office last week regarding symptoms.  They recommended a nasal spray.  She has been using that with Mucinex and Sudafed routinely.  She will follow-up with them.  5. 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  She is on pain medication and Neurontin with relief.  6. Lumbar disc disease with radiculopathy.  Dr. Samara Deist plans to administer epidural steroid injection next week.    Time 45 minutes spent face-to-face in consultation and coordination of care.   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   Support Contact Extended Emergency Contact Information  Primary Emergency Contact: Lynch,Felicia  Address: [address] [place], [place] [address] [employer] Phone: [phone]  Relation: Daughter         Care Team   Patient Care Team:  Erskine Emery, MD as PCP - General (Family Medicine)  Sterling Big, Audry Pili (Surgery, Orthopedic)  Alveda Reasons, RN  Gabriel Rung, MD (Anesthesiology)  Nanine Means, RN  Karie Chimera, MD (Surgery)  Bella Kennedy, MD (Anesthesiology)      Cancer Diagnosis Information   Symptoms/Signs 1.  Presented May 2020 with bulky mediastinal mass.  2.  06/22/2018 core needle biopsy of mediastinal lesion revealed CD10 negative CD30 negative diffuse large B-cell lymphoma.  Both FISH and IHC for MYC were negative.    3.  06/23/2018 PET/CT revealed bulky hypermetabolic mediastinal mass along with lesions in the pleura and liver.  Staging bone marrow was negative.  4.  R-CHOP started May 2020  5.  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 KNEE ARTHROSCOPY  2001   ? HX CESAREAN SECTION      Epidural   ? HX HYSTERECTOMY      Partial   ? SPLENECTOMY, PARTIAL      1994      Tumor Type/Histology/Grade          Background Information Family History/predisposing conditions Family History   Problem Relation Age of Onset   ? Coronary Artery Disease Father    ? Hypertension Father           Social History Social History     Tobacco Use   ? Smoking status: Former Smoker   ? Smokeless tobacco: Never Used   Substance Use Topics   ? Alcohol use: No     Alcohol/week: 0.0 standard drinks   ? Drug use: No                     Oncology/Hematology Plan   Treatment goal Curative     Plan Name IP HEME DOSE-ADJUSTED R-EPOCH   Status Inactive   Start Date     End Date    Provider Cyndee Brightly, DO   Chemotherapy predniSONE (DELTASONE) tablet 100 mg, 100 mg (original dose 60 mg/m2),   Dose modification: 100 mg (original dose 60 mg/m2, Cycle 1)    cyclophosphamide (CYTOXAN) 1,200 mg in sodium chloride 0.9% (NS) 310 mL IVPB, 750 mg/m2,     vinCRIStine (ONCOVIN) 0.66 mg, DOXOrubicin (ADRIAMYCIN) 16.4 mg, etoposide (VEPESID) 82  mg in sodium chloride 0.9% (NS) 1,000 mL IVPB, ,     rituximab-pvvr (RUXIENCE) 600 mg in sodium chloride 0.9% (NS) 600 mL IVPB, 375 mg/m2,        Oncology/Hematology Plan   Treatment goal Curative     Plan Name IP HEME RITUXIMAB   Status Inactive   Start Date     End Date    Provider Cyndee Brightly, DO   Chemotherapy rituximab-pvvr (RUXIENCE) 600 mg in sodium chloride 0.9% (NS) 600 mL IVPB, 375 mg/m2,        Oncology/Hematology Plan   Treatment goal Control    Plan Name IP HEME RITUXIMAB    Status Inactive   Start Date     End Date    Provider Cyndee Brightly, DO   Chemotherapy rituximab-pvvr (RUXIENCE) 600 mg in sodium chloride 0.9% (NS) 600 mL IVPB, 375 mg/m2 = 600 mg,        Oncology/Hematology Plan   Treatment goal Control    Plan Name IP HEME DOSE-ADJUSTED R-EPOCH   Status Inactive   Start Date     End Date    Provider Cyndee Brightly, DO   Chemotherapy predniSONE (DELTASONE) tablet 100 mg, 100 mg (original dose 60 mg/m2),   Dose modification: 100 mg (original dose 60 mg/m2, Cycle 1) cyclophosphamide (CYTOXAN) 1,300 mg in sodium chloride 0.9% (NS) 315 mL IVPB, 750 mg/m2,     vinCRIStine (ONCOVIN) 0.67 mg, DOXOrubicin (ADRIAMYCIN) 16.7 mg, etoposide (VEPESID) 83.6 mg in sodium chloride 0.9% (NS) 1,000 mL IVPB, ,     rituximab-pvvr (RUXIENCE) 650 mg in sodium chloride 0.9% (NS) 650 mL IVPB, 375 mg/m2 = 650 mg,        Oncology/Hematology Plan   Treatment goal Curative     Plan Name IP/OP HEME R-CHOP   Status Inactive   Start Date 06/24/2018    End Date 10/08/2018   Provider Violeta Gelinas, MD   Chemotherapy DOXOrubicin (ADRIAMYCIN) injection 83.5 mg, 50 mg/m2 = 83.5 mg,     cyclophosphamide (CYTOXAN) 1,300 mg in sodium chloride 0.9% (NS) 315 mL IVPB, 750 mg/m2 = 1,300 mg,     vinCRIStine (ONCOVIN) 2 mg in sodium chloride 0.9% (NS) 50 mL IVPB, 2 mg (original dose 1.4 mg/m2),   Dose modification: 2 mg (original dose 1.4 mg/m2, Cycle 1, Reason: Other (See Comment)), 2 mg (original dose 1.4 mg/m2, Cycle 2, Reason: Other (See Comment), Comment: DOSE CAP 2MG )    rituximab-pvvr (RUXIENCE) 650 mg in sodium chloride 0.9% (NS) 250 mL IVPB (rapid infusion), 375 mg/m2 = 650 mg,     rituximab-pvvr (RUXIENCE) 650 mg in sodium chloride 0.9% (NS) 650 mL IVPB, 375 mg/m2 = 650 mg,             Pre-Treatment Post-Treatment   Height 162.7 cm Ht Readings from Last 1 Encounters:   12/03/18 162.6 cm (64)      Weight 59.2 cm Wt Readings from Last 1 Encounters:   12/03/18 61.2 kg (135 lb)      BSA  Estimated body surface area is 1.66 meters squared as calculated from the following:    Height as of 12/03/18: 162.6 cm (64).    Weight as of 12/03/18: 61.2 kg (135 lb).      Lifetime Dosage   Lifetime Dose Tracking   ? doxorubicin: 307.412 mg/m2 (501 mg)   ? vincristine: 12 mg                  Preventive Screening Guidelines for Healthy  Adults Getting preventive care is one of the most important steps you can take to manage your health. That's because when a condition is diagnosed early, it is usually easier to treat. And regular checkups can help you and your doctor identify lifestyle changes you can make to avoid certain conditions.    Please see the screening guidelines below to see if you're up-to-date.  Routine Checkups 18-29 years 30-39 years 40-49 years 50-64 years 65+ years   Includes personal history; blood pressure; body mass index (BMI); physical exam; preventive screening; and counseling Annually for  ages 18?21     Annually Annually    Every 1?3 years, depending on risk factors2     Cancer Screenings        Colorectal Cancer Not routine except for patients at high risk2 Colonoscopy at age 59 and then every 10 years, or annual fecal occult blood test (FOBT) plus sigmoidoscopy every 5 years, or sigmoidoscopy every 5 years, or double-contrast barium enema every 5 years   Skin Cancer Periodic total skin exams every 3 years at discretion of clinician Annual total skin exam at discretion of clinician   Breast Cancer  Annual clinical breast exam and monthly self-exam     Annual mammography  at discretion  of clinician Annual  mammography Annual mammography  at discretion  of clinician   Cervical Cancer  Initiate Pap test at 3 years after first sexual intercourse, or by age 62 every 1-3 years,3 depending on risk factors2   Other Recommended Screenings        Body Mass Index (BMI) At discretion of clinician (can be screened annually for overweight and eating disorders, consult the CDC's growth and BMI charts)   Blood Pressure (Hypertension) At every acute/nonacute medical encounter and at least once every 2 years   Cholesterol Every 5 years or more often at discretion of clinician   Diabetes (Type 2)   Every 3 years, beginning at age 71 or more often and beginning  at a younger age at discretion of clinician Bone Mass Density (BMD) Test    Consider your risk factors, discuss with you  clinician. BMD testing for all post-menopausal  women who have one or more risk factors for  osteoporosis fractures. BMD test once,  or more often at  discretion of clinician   Infectious Disease Screening        Sexually Transmitted Infections  (Chlamydia, Gonorrhea, Syphilis, and HPV) Annual screenings for sexually active patients under 25; annually for patients age 65 and over if at risk2  HPV is for age 49 and under, if not previously vaccinated.   Sensory Screenings        Eye Exam for Glaucoma At least once. Every 3?5 years if at risk2 Every 2?4 years Every 1?2 years   Hearing and Vision Assessment At discretion of clinician   Immunizations        Tetanus, Diphtheria (Td) 3 doses if not previously immunized. Booster every 10 years   Influenza Every year if at high risk2 Annually   Pneumococcal If at high risk2 and not previously immunized Once after age 16,  even if previously  vaccinated   Meningococcal (Meningitis) 1 or more doses if not previously immunized, depending on risk factors and other indicators2   Varicella (Chicken Pox) 2 doses given at or after age 70 if susceptible2

## 2018-12-22 ENCOUNTER — Encounter: Admit: 2018-12-22 | Discharge: 2018-12-22 | Payer: MEDICARE

## 2018-12-22 DIAGNOSIS — M5116 Intervertebral disc disorders with radiculopathy, lumbar region: Secondary | ICD-10-CM

## 2018-12-23 ENCOUNTER — Encounter: Admit: 2018-12-23 | Discharge: 2018-12-23 | Payer: MEDICARE

## 2018-12-23 ENCOUNTER — Ambulatory Visit: Admit: 2018-12-23 | Discharge: 2018-12-23 | Payer: MEDICARE

## 2018-12-23 DIAGNOSIS — M5116 Intervertebral disc disorders with radiculopathy, lumbar region: Secondary | ICD-10-CM

## 2018-12-23 DIAGNOSIS — C8590 Non-Hodgkin lymphoma, unspecified, unspecified site: Secondary | ICD-10-CM

## 2018-12-23 DIAGNOSIS — M48061 Spinal stenosis, lumbar region without neurogenic claudication: Secondary | ICD-10-CM

## 2018-12-23 DIAGNOSIS — R079 Chest pain, unspecified: Secondary | ICD-10-CM

## 2018-12-23 DIAGNOSIS — I10 Essential (primary) hypertension: Secondary | ICD-10-CM

## 2018-12-23 DIAGNOSIS — M5416 Radiculopathy, lumbar region: Secondary | ICD-10-CM

## 2018-12-23 DIAGNOSIS — C801 Malignant (primary) neoplasm, unspecified: Secondary | ICD-10-CM

## 2018-12-23 MED ORDER — IOHEXOL 240 MG IODINE/ML IV SOLN
2.5 mL | Freq: Once | EPIDURAL | 0 refills | Status: CP
Start: 2018-12-23 — End: ?
  Administered 2018-12-23: 17:00:00 2.5 mL via EPIDURAL

## 2018-12-23 MED ORDER — TRIAMCINOLONE ACETONIDE 40 MG/ML IJ SUSP
80 mg | Freq: Once | EPIDURAL | 0 refills | Status: CP
Start: 2018-12-23 — End: ?
  Administered 2018-12-23: 17:00:00 80 mg via EPIDURAL

## 2018-12-23 NOTE — Progress Notes
PT PRESENTS WITH EXTENDED STAY R/T HEAVY LEG TO RLE, MD NOTIFIED. PT ABLE TO AMBULATE AROUND NURSES STATION WITHOUT DIFFICULTY. PT REQUESTED TO BE WHEELED OUT IN A WC PRIOR TO DC HOME.

## 2018-12-23 NOTE — Procedures
Attending Surgeon: Kennard Fildes B Markevion Lattin, MD    Anesthesia: Local    Pre-Procedure Diagnosis:   1. Lumbar disc disease with radiculopathy    2. Lumbar radicular pain        Post-Procedure Diagnosis:   1. Lumbar disc disease with radiculopathy    2. Lumbar radicular pain        SNR/TF LMBR/SAC  Procedure: transforaminal epidural    Laterality: bilateral    Location: lumbar - L5-S1      Consent:   Consent obtained: written  Consent given by: patient  Risks discussed: allergic reaction, bleeding, infection, weakness and no change or worsening in pain  Alternatives discussed: alternative treatment     Universal Protocol:  Relevant documents: relevant documents present and verified  Test results: test results available and properly labeled  Imaging studies: imaging studies available  Required items: required blood products, implants, devices, and special equipment available  Site marked: the operative site was marked  Patient identity confirmed: Patient identify confirmed verbally with patient.        Time out: Immediately prior to procedure a "time out" was called to verify the correct patient, procedure, equipment, support staff and site/side marked as required      Procedures Details:   Indications: pain   Prep: chlorhexidine  Patient position: prone  Estimated Blood Loss: minimal  Specimens: none  Number of Levels: 1  Guidance: fluoroscopy  Needle size: 25 G  Injection procedure: Negative aspiration for blood  Patient tolerance: Patient tolerated the procedure well with no immediate complications. Pressure was applied, and hemostasis was accomplished.  Outcome: Pain improved  Comments: Kenalog 40 mg was injected at each location        Estimated blood loss: none or minimal  Specimens: none  Patient tolerated the procedure well with no immediate complications. Pressure was applied, and hemostasis was accomplished.
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
## 2018-12-23 NOTE — Patient Instructions
Procedure Completed Today: Lumbar Transforaminal Steroid Injection    Important information following your procedure today: You may drive today    1. Pain relief may not be immediate. It is possible you may even experience an increase in pain during the first 24-48 hours followed by a gradual decrease of your pain.  2. Though the procedure is generally safe and complications are rare, we do ask that you be aware of any of the following:   ? Any swelling, persistent redness, new bleeding, or drainage from the site of the injection.  ? You should not experience a severe headache.  ? You should not run a fever over 101? F.  ? New onset of sharp, severe back & or neck pain.  ? New onset of upper or lower extremity numbness or weakness.  ? New difficulty controlling bowel or bladder function after the injection.  ? New shortness of breath.    If any of these occur, please call to report this occurrence to a nurse at [phone]. If you are calling after 4:00 p.m., on weekends or holidays please call [phone] and ask to have the resident physician on call for the physician paged or go to your local emergency room.  3. You may experience soreness at the injection site. Ice can be applied at 20 minute intervals. Avoid application of direct heat, hot showers or hot tubs today.  4. Avoid strenuous activity today. You may resume your regular activities and exercise tomorrow.  5. Patients with diabetes may see an elevation in blood sugars for 7-10 days after the injection. It is important to pay close attention to your diet, check your blood sugars daily and report extreme elevations to the physician that treats your diabetes.  6. Patients taking a daily blood thinner can resume their regular dose this evening. 7. It is important that you take all medications ordered by your pain physician. Taking medication as ordered is an important part of your pain care plan. If you cannot continue the medication plan, please notify the physician.     Possible side effects to steroids that may occur:  ? Flushing or redness of the face  ? Irritability  ? Fluid retention  ? Change in women?s menses    The following medications were used: Lidocaine , Triamcinolone   and Contrast Dye

## 2018-12-23 NOTE — Progress Notes

## 2019-01-01 ENCOUNTER — Encounter: Admit: 2019-01-01 | Discharge: 2019-01-01 | Payer: MEDICARE

## 2019-01-01 NOTE — Telephone Encounter
Patient requesting refill of oxycodone 7.5/325mg   LOV: 12/03/2018  UOV: 02/22/2019  Last filled: 12/02/2018

## 2019-01-03 MED ORDER — OXYCODONE-ACETAMINOPHEN 7.5-325 MG PO TAB
1 | ORAL_TABLET | ORAL | 0 refills | 2.00000 days | Status: DC | PRN
Start: 2019-01-03 — End: 2019-02-01

## 2019-01-12 ENCOUNTER — Encounter: Admit: 2019-01-12 | Discharge: 2019-01-12 | Payer: MEDICARE

## 2019-01-12 DIAGNOSIS — M545 Low back pain: Secondary | ICD-10-CM

## 2019-01-12 DIAGNOSIS — M48062 Spinal stenosis, lumbar region with neurogenic claudication: Secondary | ICD-10-CM

## 2019-01-12 NOTE — Telephone Encounter
Pre Visit Planning- New Patient    Records received: No    Orders have been NA    Patient active in [place]. No appointment reminder sent. .     Left voicemail message.    Updated chart: Not assessed    MRI from 2014 PACS no recent images on file.

## 2019-01-18 ENCOUNTER — Encounter: Admit: 2019-01-18 | Discharge: 2019-01-18 | Payer: MEDICARE

## 2019-01-18 ENCOUNTER — Ambulatory Visit: Admit: 2019-01-18 | Discharge: 2019-01-18 | Payer: MEDICARE

## 2019-01-18 DIAGNOSIS — M545 Low back pain: Secondary | ICD-10-CM

## 2019-01-18 DIAGNOSIS — M48062 Spinal stenosis, lumbar region with neurogenic claudication: Secondary | ICD-10-CM

## 2019-01-18 DIAGNOSIS — I10 Essential (primary) hypertension: Secondary | ICD-10-CM

## 2019-01-18 DIAGNOSIS — R079 Chest pain, unspecified: Secondary | ICD-10-CM

## 2019-01-18 DIAGNOSIS — C801 Malignant (primary) neoplasm, unspecified: Secondary | ICD-10-CM

## 2019-01-18 DIAGNOSIS — M48061 Spinal stenosis, lumbar region without neurogenic claudication: Secondary | ICD-10-CM

## 2019-01-18 DIAGNOSIS — C8590 Non-Hodgkin lymphoma, unspecified, unspecified site: Secondary | ICD-10-CM
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
## 2019-01-21 ENCOUNTER — Encounter: Admit: 2019-01-21 | Discharge: 2019-01-21 | Payer: MEDICARE

## 2019-01-21 DIAGNOSIS — C8590 Non-Hodgkin lymphoma, unspecified, unspecified site: Secondary | ICD-10-CM

## 2019-01-21 DIAGNOSIS — I10 Essential (primary) hypertension: Secondary | ICD-10-CM

## 2019-01-21 DIAGNOSIS — C801 Malignant (primary) neoplasm, unspecified: Secondary | ICD-10-CM

## 2019-01-21 DIAGNOSIS — R079 Chest pain, unspecified: Secondary | ICD-10-CM

## 2019-01-21 DIAGNOSIS — M48061 Spinal stenosis, lumbar region without neurogenic claudication: Secondary | ICD-10-CM

## 2019-02-01 ENCOUNTER — Encounter: Admit: 2019-02-01 | Discharge: 2019-02-01 | Payer: MEDICARE

## 2019-02-01 MED ORDER — OXYCODONE-ACETAMINOPHEN 7.5-325 MG PO TAB
1 | ORAL_TABLET | ORAL | 0 refills | 2.00000 days | Status: DC | PRN
Start: 2019-02-01 — End: 2019-02-22

## 2019-02-01 NOTE — Telephone Encounter
Patient requesting refill of oxycodone 7.5/325 mg  LOV: 12/03/2018  UOV: 02/22/2019  Last filled: 01/03/2019

## 2019-02-08 ENCOUNTER — Ambulatory Visit: Admit: 2019-02-08 | Discharge: 2019-02-08 | Payer: MEDICARE

## 2019-02-08 ENCOUNTER — Encounter: Admit: 2019-02-08 | Discharge: 2019-02-08 | Payer: MEDICARE

## 2019-02-08 DIAGNOSIS — M545 Low back pain: Secondary | ICD-10-CM

## 2019-02-08 DIAGNOSIS — M48062 Spinal stenosis, lumbar region with neurogenic claudication: Secondary | ICD-10-CM

## 2019-02-10 ENCOUNTER — Encounter: Admit: 2019-02-10 | Discharge: 2019-02-10 | Payer: MEDICARE

## 2019-02-16 ENCOUNTER — Encounter: Admit: 2019-02-16 | Discharge: 2019-02-16 | Payer: MEDICARE

## 2019-02-17 ENCOUNTER — Encounter

## 2019-02-17 DIAGNOSIS — M5136 Other intervertebral disc degeneration, lumbar region: Secondary | ICD-10-CM

## 2019-02-17 DIAGNOSIS — M545 Low back pain: Secondary | ICD-10-CM

## 2019-02-17 DIAGNOSIS — C8332 Diffuse large B-cell lymphoma, intrathoracic lymph nodes: Secondary | ICD-10-CM

## 2019-02-17 DIAGNOSIS — R079 Chest pain, unspecified: Secondary | ICD-10-CM

## 2019-02-17 DIAGNOSIS — I10 Essential (primary) hypertension: Secondary | ICD-10-CM

## 2019-02-17 DIAGNOSIS — C801 Malignant (primary) neoplasm, unspecified: Secondary | ICD-10-CM

## 2019-02-17 DIAGNOSIS — M48061 Spinal stenosis, lumbar region without neurogenic claudication: Secondary | ICD-10-CM

## 2019-02-17 DIAGNOSIS — C8590 Non-Hodgkin lymphoma, unspecified, unspecified site: Secondary | ICD-10-CM

## 2019-02-17 DIAGNOSIS — Z20822 Encounter for screening laboratory testing for COVID-19 virus in asymptomatic patient: Secondary | ICD-10-CM

## 2019-02-17 LAB — CBC AND DIFF
Lab: 0 10*3/uL (ref 0–0.20)
Lab: 0.4 10*3/uL (ref 0–0.80)
Lab: 0.4 10*3/uL — ABNORMAL LOW (ref 1.0–4.8)
Lab: 1 % (ref >60)
Lab: 11 % — ABNORMAL LOW (ref 24–44)
Lab: 12 % (ref 4–12)
Lab: 12 g/dL (ref 12.0–15.0)
Lab: 15 % — ABNORMAL HIGH (ref 11–15)
Lab: 187 K/UL (ref 150–400)
Lab: 2.4 10*3/uL (ref 1.8–7.0)
Lab: 3.2 K/UL — ABNORMAL LOW (ref 4.5–11.0)
Lab: 37 % (ref 36–45)
Lab: 4.1 M/UL — ABNORMAL LOW (ref 4.0–5.0)
Lab: 7.3 FL (ref 7–11)
Lab: 74 % — ABNORMAL HIGH (ref 41–77)
Lab: 89 FL (ref 80–100)

## 2019-02-17 LAB — COMPREHENSIVE METABOLIC PANEL
Lab: 0.4 mg/dL (ref 0.3–1.2)
Lab: 142 MMOL/L (ref 137–147)
Lab: 3.6 MMOL/L (ref 3.5–5.1)
Lab: 6.8 g/dL (ref 6.0–8.0)
Lab: >60 mL/min (ref >60)
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
## 2019-02-22 ENCOUNTER — Ambulatory Visit: Admit: 2019-02-22 | Discharge: 2019-02-23 | Payer: MEDICARE

## 2019-02-22 ENCOUNTER — Encounter: Admit: 2019-02-22 | Discharge: 2019-02-22 | Payer: MEDICARE

## 2019-02-22 DIAGNOSIS — C801 Malignant (primary) neoplasm, unspecified: Secondary | ICD-10-CM

## 2019-02-22 DIAGNOSIS — C8590 Non-Hodgkin lymphoma, unspecified, unspecified site: Secondary | ICD-10-CM

## 2019-02-22 DIAGNOSIS — M5416 Radiculopathy, lumbar region: Secondary | ICD-10-CM

## 2019-02-22 DIAGNOSIS — R079 Chest pain, unspecified: Secondary | ICD-10-CM

## 2019-02-22 DIAGNOSIS — M48061 Spinal stenosis, lumbar region without neurogenic claudication: Secondary | ICD-10-CM

## 2019-02-22 DIAGNOSIS — M5116 Intervertebral disc disorders with radiculopathy, lumbar region: Secondary | ICD-10-CM

## 2019-02-22 DIAGNOSIS — I10 Essential (primary) hypertension: Secondary | ICD-10-CM

## 2019-02-22 MED ORDER — OXYCODONE-ACETAMINOPHEN 7.5-325 MG PO TAB
1 | ORAL_TABLET | ORAL | 0 refills | 2.00000 days | Status: DC | PRN
Start: 2019-02-22 — End: 2019-05-17

## 2019-02-22 MED ORDER — OXYCODONE-ACETAMINOPHEN 7.5-325 MG PO TAB
1 | ORAL_TABLET | ORAL | 0 refills | 2.00000 days | Status: DC | PRN
Start: 2019-02-22 — End: 2019-05-29

## 2019-02-25 ENCOUNTER — Encounter: Admit: 2019-02-25 | Discharge: 2019-02-25 | Payer: MEDICARE

## 2019-02-26 MED ORDER — CYCLOBENZAPRINE 10 MG PO TAB
ORAL_TABLET | Freq: Every day | ORAL | 0 refills | 30.00000 days | Status: DC
Start: 2019-02-26 — End: 2019-05-17

## 2019-03-04 ENCOUNTER — Encounter: Admit: 2019-03-04 | Discharge: 2019-03-04 | Payer: MEDICARE

## 2019-03-22 ENCOUNTER — Encounter: Admit: 2019-03-22 | Discharge: 2019-03-22 | Payer: MEDICARE

## 2019-03-22 MED ORDER — GABAPENTIN 600 MG PO TAB
600 mg | ORAL_TABLET | Freq: Three times a day (TID) | ORAL | 11 refills | Status: AC
Start: 2019-03-22 — End: ?

## 2019-04-02 ENCOUNTER — Encounter: Admit: 2019-04-02 | Discharge: 2019-04-02 | Payer: MEDICARE

## 2019-04-02 NOTE — Telephone Encounter
Patient called asking for a letter stating that her dogs served as emotional support for her. She is moving into an apartment. RN advised Dr. Sela Hua does not do assessments for this type of request so we do not have adequate documentation to support this request. Advised she could discuss with her PCP or other provider should she see someone for mental health. Patient agreed to plan.

## 2019-05-06 ENCOUNTER — Encounter: Admit: 2019-05-06 | Discharge: 2019-05-06 | Payer: MEDICAID

## 2019-05-06 ENCOUNTER — Ambulatory Visit: Admit: 2019-05-06 | Discharge: 2019-05-06 | Payer: MEDICAID

## 2019-05-06 LAB — COMPREHENSIVE METABOLIC PANEL
Lab: 0.3 mg/dL (ref 0.3–1.2)
Lab: 0.6 mg/dL (ref 0.4–1.00)
Lab: 10 U/L (ref 7–56)
Lab: 100 MMOL/L (ref 98–110)
Lab: 14 U/L (ref 7–40)
Lab: 140 MMOL/L (ref 137–147)
Lab: 3.9 MMOL/L (ref 3.5–5.1)
Lab: 31 MMOL/L — ABNORMAL HIGH (ref 21–30)
Lab: 4.2 g/dL (ref 3.5–5.0)
Lab: 6 mg/dL — ABNORMAL LOW (ref 7–25)
Lab: 6.9 g/dL (ref 6.0–8.0)
Lab: 87 U/L — ABNORMAL LOW (ref 25–110)
Lab: 88 mg/dL (ref 70–100)
Lab: 9.5 mg/dL (ref 8.5–10.6)
Lab: >60 mL/min (ref >60)
Lab: >60 mL/min — ABNORMAL LOW (ref >60)

## 2019-05-06 LAB — CBC AND DIFF
Lab: 0 10*3/uL (ref 0–0.20)
Lab: 3.6 10*3/uL — ABNORMAL LOW (ref 4.5–11.0)

## 2019-05-06 LAB — LDH-LACTATE DEHYDROGENASE: Lab: 160 U/L (ref 100–210)

## 2019-05-06 NOTE — Patient Instructions
SURGERY May 26, 2019 - CHECK IN WITH ADMISSIONS AT 5:30 AM THE MORNING OF SURGERY. NOTHING TO EAT OR DRINK AFTER MIDNIGHT THE NIGHT BEFORE YOUR SURGERY.    USE CHLORHEXIDINE SPONGES TO TAKE 2 SHOWERS THE NIGHT BEFORE SURGERY. THESE CAN BE DONE BEFORE BED AND MORNING BEFORE SURGERY OR EARLY EVENING AND RIGHT BEFORE BED IF CHECK IN IS EARLY.    YOU WILL LEAVE THE HOSPITAL WITH A PRESCRIPTION FOR PAIN MEDICATION. THIS USUALLY WILL LAST YOU UNTIL YOUR FIRST POST OP APPOINTMENT.     WHEN YOU ARE IN NEED OF A REFILL, PLEASE CALL OUR OFFICE. REMEMBER TO GIVE OUR OFFICE 2-3 DAYS ADVANCED NOTICE THAT YOU REQUIRE A REFILL. IF A PRESCRIPTION NEEDS TO BE MAILED, PLEASE GIVE THIS OFFICE 7 DAYS ADVANCED NOTICE.    SAME DAY PRESCRIPTIONS CANNOT BE OBTAINED.    AFTER SURGERY, IF YOU HAVE ANY QUESTIONS, PLEASE CALL [phone].    IF AFTER HOURS OR WEEKENDS, CALL [phone] AND ASK FOR THE ORTHOPEDIC SPINE SURGERY RESIDENT ON CALL.

## 2019-05-06 NOTE — Pre-Anesthesia Patient Instructions
GENERAL INFORMATION    Before you come to the hospital  ? Make arrangements for a responsible adult to drive you home and stay with you for 24 hours following surgery.  ? Bath/Shower Instructions  ? Take a bath or shower with antibacterial soap the night before or the morning of your procedure. Use clean towels.  ? Put on clean clothes after bath or shower.  Avoid using lotion and oils.  ? If you are having surgery above the waist, wear a shirt that fastens up the front.  ? Sleep on clean sheets if bath or shower is done the night before procedure.  ? Leave money, credit cards, jewelry, and any other valuables at home. [facility] is not responsible for the loss or breakage of personal items.  ? Remove nail polish, makeup and all jewelry (including piercings) before coming to the hospital.  ? The morning of your procedure:  ? brush your teeth and tongue  ? do not smoke  ? do not shave the area where you will have surgery    What to bring to the hospital  ? ID/ Insurance Card  ? Medical Device card  ? Official documents for legal guardianship   ? Copy of your Living Will, Advanced Directives, and/or Durable Power of Attorney   ? Small bag with a few personal belongings  ? CPAP/BiPAP machine (including all supplies)  ? Walker,cane, or motorized scooter  ? Cases for glasses/hearing aids/contact lens (bring solutions for contacts)  ? Dress in clean, loose, comfortable clothing     Eating or drinking before surgery  ? Do not eat or drink anything after 11:00 p.m. the day before your procedure (including gum, mints, candy, or chewing tobacco) OR follow the specific instructions you were given by your Surgeon.  ? You may have WATER ONLY up to 2 hours before arriving at the hospital.     Other instructions  Notify your surgeon if:  ? you become ill with a cough, fever, sore throat, nausea, vomiting or flu-like symptoms  ? you develop a rash or have any open wounds/sores that are red, painful, draining, or are new since you last saw  the doctor  ? you need to cancel your procedure  ? You will receive your day of surgery arrival time from your Surgeon's office.  If you have any questions, please contact your Surgeon's office for assistance.    Notify us at [facility]: [phone]  ? if you need to cancel your procedure  ? if you are going to be late    Arrival at the hospital  [facility]  [address]  [place], [place] [address]    ? Park in the [employer], located directly across from the main entrance to the hospital.  ? Judee Clara parking is available  from 7 AM to 4 PM Monday through Friday.  ? Enter through the ground floor main hospital entrance and check in at the Information Desk in the lobby.  ? They will validate your parking ticket and direct you to the next location.  ? If you are a woman between the ages of 23 and 31, and have not had a hysterectomy, you will be asked for a urine sample prior to surgery.  Please do not urinate before arriving in the Surgery Waiting Room.  Once there, check in and let the attendant know if you need to provide a sample.  Coronavirus (COVID19) Information  If you get sick with fever (100.55F/38C or higher), cough, or have trouble breathing:  ? Call your primary care physician for questions or health needs.  ? Tell your doctor about any recent travel and your symptoms.  ? Check your MyChart for your COVID swab results. (MyChart notifications are immediate and patients are often know their test result before their surgeon is notified).  ? Notify your surgeon if you are COVID+ positive.  ? If you are COVID+ positive DO NOT come to the hospital for your surgery until your surgeon has instructed you on what to do. Wait for instructions to find out if you should stay home or if you should still have surgery.  ? Avoid contact with others.    For up to date information on the Coronavirus, visit the CDC website at DiningCalendar.de.    For the safety of all patients, visitors and staff as we work to contain COVID-19, we must restrict patient visitors.      Current Visitor Policy (04/06/19):  Two visitors per patient per day.  Exceptions include:    Inpatients may have two daily visitors, but only one of those visitors is allowed at a time. Both visitors cannot visit at the same time.  Only one visitor can accompany a surgical or procedural patient visit.  Two parents/guardian for patients younger than 48.  End-of-life patients may be allowed additional support persons.  Visitors should check with the patient's nurse.  Only cancer patients at their exam visit may have one visitor with them.  No visitors are allowed for cancer patients receiving treatment/infusion services.  This applies at all cancer center locations.  Restrictions still apply for patients who test positive for COVID-19 (no visitors).    Visitors will continue to be screened at all entrances.  They must be free of fever and symptoms to be in our facilities.  We ask visitors to follow these guidelines:  Wear a mask at all times.  Go directly to the nursing station in the unit you are visiting and do not linger in public areas.  Check in at the nursing station before going to the patient's room.  Maintain a physical distance of six feet from all others.  Follow elevator restrictions to four riding at a time - peak times are 6:30-7:30 a.m., noon and 6:30-7:30 p.m.  Be aware cafeteria peak times are 11 a.m. - 1 p.m.  Wash your hands frequently and cover your coughs and sneezes.        You will need to have a COVID19 test performed 2-3 days prior to your surgery.  Your COVID19 test is scheduled for 05/24/19 at 11:00AM.  Your test will be performed at a drive-thru clinic at the [facility].  If you are also planning to have lab work drawn while at the campus, please do so first and then have your COVID19 test completed second.    [facility]  [address]  [place], [place] [address]    Please bring a cell phone, your photo ID and your insurance card.  Please use the bathroom prior to your trip to the [facility] for your test.    Once arrived at the [facility] drive-thru clinic, follow the signage/cones to a parking spot.  Park and call [phone] to check in.  The team will come out to you.  You do not have to get out of your vehicle.  Our team members will collect the test sample  using a swab and will be wearing all of the necessary personal protective equipment, so you do not need to wear a mask.  Once the test is performed it will be important to self quarantine at home until your surgery.  Please stay at home, wash your hands frequently, and practice physical distancing.

## 2019-05-10 ENCOUNTER — Encounter: Admit: 2019-05-10 | Discharge: 2019-05-10 | Payer: MEDICAID

## 2019-05-10 DIAGNOSIS — C8332 Diffuse large B-cell lymphoma, intrathoracic lymph nodes: Secondary | ICD-10-CM

## 2019-05-18 NOTE — Telephone Encounter
Kara Andersen called to ask to cancel her appointment tomorrow. She has lumbar fusion scheduled for 04/28 and had her labs drawn and she feels are stable and she has no new or concerning issues.    I returned her call and told her that I can send to scheduling to return to clinic in 3 months per her request. She thanked [place] for the return call and had no other questions or concerns at this time.

## 2019-05-26 MED ORDER — PHENYLEPHRINE HCL IN 0.9% NACL 1 MG/10 ML (100 MCG/ML) IV SYRG
INTRAVENOUS | 0 refills | Status: DC
Start: 2019-05-26 — End: 2019-05-26
  Administered 2019-05-26 (×2): 100 ug via INTRAVENOUS
  Administered 2019-05-26: 13:00:00 200 ug via INTRAVENOUS

## 2019-05-26 MED ORDER — LACTATED RINGERS IV SOLP
1000 mL | INTRAVENOUS | 0 refills | Status: DC
Start: 2019-05-26 — End: 2019-05-26

## 2019-05-26 MED ORDER — HYDROMORPHONE (PF) 2 MG/ML IJ SYRG
0 refills | Status: DC
Start: 2019-05-26 — End: 2019-05-26
  Administered 2019-05-26: 18:00:00 .5 mg via INTRAVENOUS
  Administered 2019-05-26: 18:00:00 0.5 mg via INTRAVENOUS
  Administered 2019-05-26: 18:00:00 .5 mg via INTRAVENOUS

## 2019-05-26 MED ORDER — TRANEXAMIC ACID IN NS IVPB (AM)(OR)
10 mg/kg | Freq: Once | INTRAVENOUS | 0 refills | Status: CP
Start: 2019-05-26 — End: ?
  Administered 2019-05-26 (×2): 632 mg via INTRAVENOUS

## 2019-05-26 MED ORDER — LACTATED RINGERS IV SOLP
INTRAVENOUS | 0 refills | Status: DC
Start: 2019-05-26 — End: 2019-05-27
  Administered 2019-05-26 (×2): 1000.000 mL via INTRAVENOUS

## 2019-05-26 MED ORDER — DIPHENHYDRAMINE HCL 50 MG/ML IJ SOLN
25 mg | INTRAVENOUS | 0 refills | Status: DC | PRN
Start: 2019-05-26 — End: 2019-05-29
  Administered 2019-05-26: 21:00:00 25 mg via INTRAVENOUS

## 2019-05-26 MED ORDER — SUCCINYLCHOLINE CHLORIDE 20 MG/ML IJ SOLN
INTRAVENOUS | 0 refills | Status: DC
Start: 2019-05-26 — End: 2019-05-26
  Administered 2019-05-26: 12:00:00 40 mg via INTRAVENOUS

## 2019-05-26 MED ORDER — ROCURONIUM 10 MG/ML IV SOLN
INTRAVENOUS | 0 refills | Status: DC
Start: 2019-05-26 — End: 2019-05-26
  Administered 2019-05-26: 13:00:00 20 mg via INTRAVENOUS
  Administered 2019-05-26: 16:00:00 10 mg via INTRAVENOUS

## 2019-05-26 MED ORDER — ONDANSETRON HCL (PF) 4 MG/2 ML IJ SOLN
4 mg | INTRAVENOUS | 0 refills | Status: DC | PRN
Start: 2019-05-26 — End: 2019-05-29
  Administered 2019-05-27: 03:00:00 4 mg via INTRAVENOUS

## 2019-05-26 MED ORDER — PROMETHAZINE 25 MG/ML IJ SOLN
6.25 mg | INTRAVENOUS | 0 refills | Status: DC | PRN
Start: 2019-05-26 — End: 2019-05-26

## 2019-05-26 MED ORDER — BISACODYL 10 MG RE SUPP
10 mg | Freq: Every day | RECTAL | 0 refills | Status: DC | PRN
Start: 2019-05-26 — End: 2019-05-29

## 2019-05-26 MED ORDER — DEXAMETHASONE SODIUM PHOSPHATE 4 MG/ML IJ SOLN
INTRAVENOUS | 0 refills | Status: DC
Start: 2019-05-26 — End: 2019-05-26
  Administered 2019-05-26: 13:00:00 4 mg via INTRAVENOUS

## 2019-05-26 MED ORDER — HYDROMORPHONE (PF) 2 MG/ML IJ SYRG
.5-1 mg | INTRAVENOUS | 0 refills | Status: DC | PRN
Start: 2019-05-26 — End: 2019-05-26
  Administered 2019-05-26 (×2): 0.5 mg via INTRAVENOUS

## 2019-05-26 MED ORDER — POLYETHYLENE GLYCOL 3350 17 GRAM PO PWPK
1 | Freq: Two times a day (BID) | ORAL | 0 refills | Status: DC
Start: 2019-05-26 — End: 2019-05-29
  Administered 2019-05-27 – 2019-05-29 (×4): 17 g via ORAL

## 2019-05-26 MED ORDER — PROPOFOL INJ 10 MG/ML IV VIAL
INTRAVENOUS | 0 refills | Status: DC
Start: 2019-05-26 — End: 2019-05-26
  Administered 2019-05-26: 12:00:00 100 mg via INTRAVENOUS

## 2019-05-26 MED ORDER — PROPOFOL 10 MG/ML IV EMUL 100 ML (INFUSION)(AM)(OR)
INTRAVENOUS | 0 refills | Status: DC
Start: 2019-05-26 — End: 2019-05-26
  Administered 2019-05-26: 12:00:00 100 ug/kg/min via INTRAVENOUS

## 2019-05-26 MED ORDER — ELECTROLYTE-A IV SOLP
0 refills | Status: DC
Start: 2019-05-26 — End: 2019-05-26
  Administered 2019-05-26 (×2): via INTRAVENOUS

## 2019-05-26 MED ORDER — CEFAZOLIN INJ 1GM IVP
1 g | INTRAVENOUS | 0 refills | Status: CP
Start: 2019-05-26 — End: ?
  Administered 2019-05-27 (×2): 1 g via INTRAVENOUS

## 2019-05-26 MED ORDER — KETAMINE 10 MG/ML IJ SOLN (INFUSION)(AM)(OR)
INTRAVENOUS | 0 refills | Status: DC
Start: 2019-05-26 — End: 2019-05-26
  Administered 2019-05-26: 13:00:00 200 ug/kg/h via INTRAVENOUS

## 2019-05-26 MED ORDER — FENTANYL CITRATE (PF) 50 MCG/ML IJ SOLN
25-50 ug | INTRAVENOUS | 0 refills | Status: DC | PRN
Start: 2019-05-26 — End: 2019-05-26
  Administered 2019-05-26: 18:00:00 50 ug via INTRAVENOUS

## 2019-05-26 MED ORDER — DIPHENHYDRAMINE HCL 25 MG PO CAP
25 mg | ORAL | 0 refills | Status: DC | PRN
Start: 2019-05-26 — End: 2019-05-29
  Administered 2019-05-27 – 2019-05-29 (×4): 25 mg via ORAL

## 2019-05-26 MED ORDER — ALBUMIN, HUMAN 5 % 500 ML IV SOLP (AN)(OSM)
0 refills | Status: DC
Start: 2019-05-26 — End: 2019-05-26
  Administered 2019-05-26: 15:00:00 via INTRAVENOUS

## 2019-05-26 MED ORDER — PHENYLEPHRINE 40 MCG/ML IN NS IV DRIP (STD CONC)
0 refills | Status: DC
Start: 2019-05-26 — End: 2019-05-26
  Administered 2019-05-26 (×2): .5 ug/kg/min via INTRAVENOUS

## 2019-05-26 MED ORDER — HALOPERIDOL LACTATE 5 MG/ML IJ SOLN
1 mg | Freq: Once | INTRAVENOUS | 0 refills | Status: DC | PRN
Start: 2019-05-26 — End: 2019-05-26

## 2019-05-26 MED ORDER — MIDAZOLAM 1 MG/ML IJ SOLN
INTRAVENOUS | 0 refills | Status: DC
Start: 2019-05-26 — End: 2019-05-26
  Administered 2019-05-26: 12:00:00 2 mg via INTRAVENOUS

## 2019-05-26 MED ORDER — CEFAZOLIN 1 GRAM IJ SOLR
0 refills | Status: DC
Start: 2019-05-26 — End: 2019-05-26
  Administered 2019-05-26 (×2): 2 g via INTRAVENOUS

## 2019-05-26 MED ORDER — ONDANSETRON HCL (PF) 4 MG/2 ML IJ SOLN
INTRAVENOUS | 0 refills | Status: DC
Start: 2019-05-26 — End: 2019-05-26
  Administered 2019-05-26: 18:00:00 4 mg via INTRAVENOUS

## 2019-05-26 MED ORDER — DIPHENHYDRAMINE HCL 50 MG/ML IJ SOLN
25 mg | Freq: Once | INTRAVENOUS | 0 refills | Status: DC | PRN
Start: 2019-05-26 — End: 2019-05-26

## 2019-05-26 MED ORDER — TRANEXAMIC ACID 1 G IVPB (WEIGHT BASED)
1 mg/kg/h | Freq: Once | INTRAVENOUS | 0 refills | Status: AC
Start: 2019-05-26 — End: ?

## 2019-05-26 MED ORDER — THROMBIN (BOVINE) 5,000 UNIT TP SOLR
0 refills | Status: DC
Start: 2019-05-26 — End: 2019-05-26
  Administered 2019-05-26: 13:00:00 5000 [IU] via TOPICAL

## 2019-05-26 MED ORDER — MAGNESIUM HYDROXIDE 2,400 MG/10 ML PO SUSP
10 mL | Freq: Every day | ORAL | 0 refills | Status: DC
Start: 2019-05-26 — End: 2019-05-29
  Administered 2019-05-27 – 2019-05-29 (×3): 10 mL via ORAL

## 2019-05-26 MED ORDER — DOCUSATE SODIUM 100 MG PO CAP
100 mg | Freq: Two times a day (BID) | ORAL | 0 refills | Status: DC
Start: 2019-05-26 — End: 2019-05-29
  Administered 2019-05-27 – 2019-05-29 (×5): 100 mg via ORAL

## 2019-05-26 MED ORDER — HYDROMORPHONE PCA 10 MG/50 ML SYR (STD CONC)(ADULT)(PREMADE)
INTRAVENOUS | 0 refills | Status: DC
Start: 2019-05-26 — End: 2019-05-27
  Administered 2019-05-26: 19:00:00 50.000 mL via INTRAVENOUS

## 2019-05-26 MED ORDER — TRANEXAMIC ACID 1 G IVPB
0 refills | Status: DC
Start: 2019-05-26 — End: 2019-05-26
  Administered 2019-05-26 (×2): 4 mg/kg/h via INTRAVENOUS

## 2019-05-26 MED ORDER — POTASSIUM CHLORIDE IN WATER 10 MEQ/50 ML IV PGBK
0 refills | Status: DC
Start: 2019-05-26 — End: 2019-05-26
  Administered 2019-05-26: 17:00:00 10 meq via INTRAVENOUS

## 2019-05-26 MED ORDER — LIDOCAINE (PF) 10 MG/ML (1 %) IJ SOLN
.1-2 mL | INTRAMUSCULAR | 0 refills | Status: DC | PRN
Start: 2019-05-26 — End: 2019-05-26

## 2019-05-26 MED ORDER — NALOXONE 0.4 MG/ML IJ SOLN
.08 mg | INTRAVENOUS | 0 refills | Status: DC | PRN
Start: 2019-05-26 — End: 2019-05-29

## 2019-05-26 MED ORDER — LACTATED RINGERS IV SOLP
1000 mL | INTRAVENOUS | 0 refills | Status: DC
Start: 2019-05-26 — End: 2019-05-27
  Administered 2019-05-26: 11:00:00 1000 mL via INTRAVENOUS

## 2019-05-26 MED ORDER — ARTIFICIAL TEARS SINGLE DOSE DROPS GROUP
0 refills | Status: DC
Start: 2019-05-26 — End: 2019-05-26
  Administered 2019-05-26: 12:00:00 2 [drp] via OPHTHALMIC

## 2019-05-26 MED ORDER — REMIFENTANYL 1000MCG IN NS 20ML (OR)
INTRAVENOUS | 0 refills | Status: DC
Start: 2019-05-26 — End: 2019-05-26
  Administered 2019-05-26 (×2): .1 ug/kg/min via INTRAVENOUS

## 2019-05-26 MED ORDER — OXYCODONE 5 MG PO TAB
5-10 mg | Freq: Once | ORAL | 0 refills | Status: DC | PRN
Start: 2019-05-26 — End: 2019-05-26

## 2019-05-26 MED ORDER — LABETALOL 5 MG/ML IV SYRG
0 refills | Status: DC
Start: 2019-05-26 — End: 2019-05-26
  Administered 2019-05-26: 18:00:00 5 mg via INTRAVENOUS

## 2019-05-26 MED ORDER — LIDOCAINE (PF) 200 MG/10 ML (2 %) IJ SYRG
0 refills | Status: DC
Start: 2019-05-26 — End: 2019-05-26
  Administered 2019-05-26: 12:00:00 100 mg via INTRAVENOUS

## 2019-05-26 MED ORDER — VANCOMYCIN 1,000 MG IV SOLR
0 refills | Status: DC
Start: 2019-05-26 — End: 2019-05-26
  Administered 2019-05-26: 13:00:00 2 g via TOPICAL

## 2019-05-26 MED ORDER — FENTANYL CITRATE (PF) 50 MCG/ML IJ SOLN
0 refills | Status: DC
Start: 2019-05-26 — End: 2019-05-26
  Administered 2019-05-26 (×2): 50 ug via INTRAVENOUS

## 2019-05-27 MED ORDER — HYDROXYZINE HCL 25 MG PO TAB
25 mg | Freq: Three times a day (TID) | ORAL | 0 refills | Status: DC | PRN
Start: 2019-05-27 — End: 2019-05-29
  Administered 2019-05-27: 11:00:00 25 mg via ORAL

## 2019-05-27 MED ORDER — ESCITALOPRAM OXALATE 20 MG PO TAB
10 mg | Freq: Every day | ORAL | 0 refills | Status: DC
Start: 2019-05-27 — End: 2019-05-29
  Administered 2019-05-27 – 2019-05-28 (×2): 10 mg via ORAL

## 2019-05-27 MED ORDER — HYDROCODONE-ACETAMINOPHEN 10-325 MG PO TAB
1-2 | ORAL | 0 refills | Status: DC | PRN
Start: 2019-05-27 — End: 2019-05-29
  Administered 2019-05-27 (×2): 2 via ORAL
  Administered 2019-05-28: 04:00:00 1 via ORAL
  Administered 2019-05-28: 23:00:00 2 via ORAL
  Administered 2019-05-28: 03:00:00 1 via ORAL
  Administered 2019-05-28 – 2019-05-29 (×4): 2 via ORAL

## 2019-05-29 ENCOUNTER — Encounter: Admit: 2019-05-29 | Discharge: 2019-05-29 | Payer: MEDICARE

## 2019-05-29 MED ORDER — HYDROCODONE-ACETAMINOPHEN 10-325 MG PO TAB
1-2 | ORAL_TABLET | ORAL | 0 refills | 30.00000 days | Status: DC | PRN
Start: 2019-05-29 — End: 2019-06-22
  Filled 2019-05-29: qty 60, 5d supply, fill #1

## 2019-05-29 MED ORDER — DOCUSATE SODIUM 100 MG PO CAP
100 mg | ORAL_CAPSULE | Freq: Two times a day (BID) | ORAL | 0 refills | Status: AC | PRN
Start: 2019-05-29 — End: ?
  Filled 2019-05-29: qty 60, 30d supply, fill #1

## 2019-05-29 MED ORDER — HYDROCODONE-ACETAMINOPHEN 10-325 MG PO TAB
1-2 | ORAL_TABLET | ORAL | 0 refills | 30.00000 days | Status: DC | PRN
Start: 2019-05-29 — End: 2019-06-22

## 2019-05-31 ENCOUNTER — Encounter: Admit: 2019-05-31 | Discharge: 2019-05-31 | Payer: MEDICARE

## 2019-05-31 DIAGNOSIS — I10 Essential (primary) hypertension: Secondary | ICD-10-CM

## 2019-05-31 DIAGNOSIS — R079 Chest pain, unspecified: Secondary | ICD-10-CM

## 2019-05-31 DIAGNOSIS — M792 Neuralgia and neuritis, unspecified: Secondary | ICD-10-CM

## 2019-05-31 DIAGNOSIS — M48061 Spinal stenosis, lumbar region without neurogenic claudication: Secondary | ICD-10-CM

## 2019-05-31 DIAGNOSIS — C801 Malignant (primary) neoplasm, unspecified: Secondary | ICD-10-CM

## 2019-05-31 DIAGNOSIS — C8590 Non-Hodgkin lymphoma, unspecified, unspecified site: Secondary | ICD-10-CM

## 2019-06-09 ENCOUNTER — Encounter: Admit: 2019-06-09 | Discharge: 2019-06-09 | Payer: MEDICARE

## 2019-06-09 NOTE — Telephone Encounter
Patient calls regarding left leg pain from knee to foot, nausea and increased flatus. Discussed that because of her anterior surgery, her bowels could be irritable and encouraged her to just give it time. She is getting nauseous as well. She has been taking medication at the same time as food. Suggested that pt ear a full meal before taking medication.     Discussed that leg pain is somewhat normal after a big surgery such as hers and reassured her that there is nothing wrong with her surgery. She had been on gabapentin, but for some reason did not restart after surgery. Suggested restarting gabapentin 600mg  hs for 3 days and then increasing to bid to calm down her leg symptoms. She verb understanding and will try these suggestions and report back if things do not improve.

## 2019-06-10 ENCOUNTER — Encounter: Admit: 2019-06-10 | Discharge: 2019-06-10 | Payer: MEDICARE

## 2019-06-10 DIAGNOSIS — R079 Chest pain, unspecified: Secondary | ICD-10-CM

## 2019-06-10 DIAGNOSIS — I10 Essential (primary) hypertension: Secondary | ICD-10-CM

## 2019-06-10 DIAGNOSIS — C8590 Non-Hodgkin lymphoma, unspecified, unspecified site: Secondary | ICD-10-CM

## 2019-06-10 DIAGNOSIS — M792 Neuralgia and neuritis, unspecified: Secondary | ICD-10-CM

## 2019-06-10 DIAGNOSIS — M48061 Spinal stenosis, lumbar region without neurogenic claudication: Secondary | ICD-10-CM

## 2019-06-10 DIAGNOSIS — C801 Malignant (primary) neoplasm, unspecified: Secondary | ICD-10-CM

## 2019-06-14 ENCOUNTER — Encounter: Admit: 2019-06-14 | Discharge: 2019-06-14 | Payer: MEDICARE

## 2019-06-14 DIAGNOSIS — C801 Malignant (primary) neoplasm, unspecified: Secondary | ICD-10-CM

## 2019-06-14 DIAGNOSIS — M48061 Spinal stenosis, lumbar region without neurogenic claudication: Secondary | ICD-10-CM

## 2019-06-14 DIAGNOSIS — I10 Essential (primary) hypertension: Secondary | ICD-10-CM

## 2019-06-14 DIAGNOSIS — R079 Chest pain, unspecified: Secondary | ICD-10-CM

## 2019-06-14 DIAGNOSIS — M792 Neuralgia and neuritis, unspecified: Secondary | ICD-10-CM

## 2019-06-14 DIAGNOSIS — C8590 Non-Hodgkin lymphoma, unspecified, unspecified site: Secondary | ICD-10-CM

## 2019-06-17 ENCOUNTER — Ambulatory Visit: Admit: 2019-06-17 | Discharge: 2019-06-17 | Payer: MEDICARE

## 2019-06-17 ENCOUNTER — Encounter: Admit: 2019-06-17 | Discharge: 2019-06-17 | Payer: MEDICARE

## 2019-06-17 DIAGNOSIS — M792 Neuralgia and neuritis, unspecified: Secondary | ICD-10-CM

## 2019-06-17 DIAGNOSIS — M7989 Other specified soft tissue disorders: Secondary | ICD-10-CM

## 2019-06-17 DIAGNOSIS — C801 Malignant (primary) neoplasm, unspecified: Secondary | ICD-10-CM

## 2019-06-17 DIAGNOSIS — M48062 Spinal stenosis, lumbar region with neurogenic claudication: Secondary | ICD-10-CM

## 2019-06-17 DIAGNOSIS — I10 Essential (primary) hypertension: Secondary | ICD-10-CM

## 2019-06-17 DIAGNOSIS — C8590 Non-Hodgkin lymphoma, unspecified, unspecified site: Secondary | ICD-10-CM

## 2019-06-17 DIAGNOSIS — M48061 Spinal stenosis, lumbar region without neurogenic claudication: Secondary | ICD-10-CM

## 2019-06-17 DIAGNOSIS — R079 Chest pain, unspecified: Secondary | ICD-10-CM

## 2019-06-17 DIAGNOSIS — M5136 Other intervertebral disc degeneration, lumbar region: Secondary | ICD-10-CM

## 2019-06-17 DIAGNOSIS — Z981 Arthrodesis status: Secondary | ICD-10-CM

## 2019-06-17 NOTE — Patient Instructions
Able to shower.  No longer need to cover the incision.  Once the Steri-strips peel off can apply lotion/Vitamin E.  Walking program only and continue to minimize bending, lifting and twisting.

## 2019-06-22 ENCOUNTER — Encounter: Admit: 2019-06-22 | Discharge: 2019-06-22 | Payer: MEDICARE

## 2019-06-22 MED ORDER — HYDROCODONE-ACETAMINOPHEN 7.5-325 MG PO TAB
1 | ORAL_TABLET | ORAL | 0 refills | 15.00000 days | Status: DC | PRN
Start: 2019-06-22 — End: 2019-07-19

## 2019-07-02 ENCOUNTER — Encounter: Admit: 2019-07-02 | Discharge: 2019-07-02 | Payer: MEDICARE

## 2019-07-02 MED ORDER — HYDROCODONE-ACETAMINOPHEN 7.5-325 MG PO TAB
1-2 | ORAL_TABLET | ORAL | 0 refills | 30.00000 days | Status: DC | PRN
Start: 2019-07-02 — End: 2019-07-19

## 2019-07-02 MED ORDER — HYDROCODONE-ACETAMINOPHEN 7.5-325 MG PO TAB
1 | ORAL_TABLET | ORAL | 0 refills | Status: CN | PRN
Start: 2019-07-02 — End: ?

## 2019-07-02 NOTE — Telephone Encounter
Patient lvm asking for refill of Hydrocodone.  Returned call informing patient order will be placed but earliest pick up will be 07/06/19.  Patient voices understanding.  Prescription routed to [facility].

## 2019-07-13 ENCOUNTER — Encounter: Admit: 2019-07-13 | Discharge: 2019-07-13 | Payer: MEDICARE

## 2019-07-13 DIAGNOSIS — M48062 Spinal stenosis, lumbar region with neurogenic claudication: Secondary | ICD-10-CM

## 2019-07-13 DIAGNOSIS — Z981 Arthrodesis status: Secondary | ICD-10-CM

## 2019-07-19 ENCOUNTER — Encounter: Admit: 2019-07-19 | Discharge: 2019-07-19 | Payer: MEDICARE

## 2019-07-19 MED ORDER — HYDROCODONE-ACETAMINOPHEN 7.5-325 MG PO TAB
1 | ORAL_TABLET | ORAL | 0 refills | 15.00000 days | Status: DC | PRN
Start: 2019-07-19 — End: 2019-08-05

## 2019-08-04 ENCOUNTER — Encounter: Admit: 2019-08-04 | Discharge: 2019-08-04 | Payer: MEDICARE

## 2019-08-05 MED ORDER — HYDROCODONE-ACETAMINOPHEN 7.5-325 MG PO TAB
1 | ORAL_TABLET | ORAL | 0 refills | 30.00000 days | Status: AC | PRN
Start: 2019-08-05 — End: ?

## 2019-08-18 ENCOUNTER — Encounter: Admit: 2019-08-18 | Discharge: 2019-08-18 | Payer: MEDICARE

## 2019-08-26 ENCOUNTER — Ambulatory Visit: Admit: 2019-08-26 | Discharge: 2019-08-26 | Payer: MEDICARE

## 2019-08-26 ENCOUNTER — Encounter: Admit: 2019-08-26 | Discharge: 2019-08-26 | Payer: MEDICARE

## 2019-08-26 DIAGNOSIS — M792 Neuralgia and neuritis, unspecified: Secondary | ICD-10-CM

## 2019-08-26 DIAGNOSIS — M5416 Radiculopathy, lumbar region: Secondary | ICD-10-CM

## 2019-08-26 DIAGNOSIS — Z981 Arthrodesis status: Secondary | ICD-10-CM

## 2019-08-26 DIAGNOSIS — I10 Essential (primary) hypertension: Secondary | ICD-10-CM

## 2019-08-26 DIAGNOSIS — M48062 Spinal stenosis, lumbar region with neurogenic claudication: Secondary | ICD-10-CM

## 2019-08-26 DIAGNOSIS — R079 Chest pain, unspecified: Secondary | ICD-10-CM

## 2019-08-26 DIAGNOSIS — C801 Malignant (primary) neoplasm, unspecified: Secondary | ICD-10-CM

## 2019-08-26 DIAGNOSIS — M48061 Spinal stenosis, lumbar region without neurogenic claudication: Secondary | ICD-10-CM

## 2019-08-26 DIAGNOSIS — M5116 Intervertebral disc disorders with radiculopathy, lumbar region: Secondary | ICD-10-CM

## 2019-08-26 DIAGNOSIS — C8590 Non-Hodgkin lymphoma, unspecified, unspecified site: Secondary | ICD-10-CM

## 2019-08-26 MED ORDER — OXYCODONE-ACETAMINOPHEN 7.5-325 MG PO TAB
1 | ORAL_TABLET | Freq: Every day | ORAL | 0 refills | 2.00000 days | Status: DC | PRN
Start: 2019-08-26 — End: 2019-08-26

## 2019-08-26 MED ORDER — OXYCODONE-ACETAMINOPHEN 7.5-325 MG PO TAB
1 | ORAL_TABLET | Freq: Every day | ORAL | 0 refills | 2.00000 days | Status: AC | PRN
Start: 2019-08-26 — End: ?

## 2019-08-26 NOTE — Patient Instructions
It was nice to see you today. Thank you for choosing to visit our clinic.      Your time is important and if you had to wait today, we do apologize. Our goal is to run exactly on time; however, on occasion, we get behind in clinic due to unexpected patient issues. Thank you for your patience.    General Instructions:  ? How to reach me: Please send a MyChart message to the [facility] or leave a voicemail for my nurse, [place] Pink, at [phone].  ? How to get a medication refill: Please use the MyChart Refill request or contact your pharmacy directly to request medication refills. Please allow 72 business hours for request to be completed.  ? How to receive your test results: If you have signed up for MyChart, you will receive your test results and messages from me this way. Otherwise, you will get a phone call or letter. If you are expecting results and have not heard from my office within 2 weeks of your testing, please send a MyChart message or call my office.  ? Scheduling: Our scheduling phone number is [phone].  ? Appointment Reminders on your cell phone: Make sure we have your cell phone number, and Text [place] to [phone].  ? Support for many chronic illnesses is available through [facility]: [email or web address] or [phone].  ? For questions on nights, weekends or holidays, call the Operator at [phone], and ask for the doctor on call for Anesthesia Pain Management.      Again, thank you for coming in today.    ________________________________________________________________

## 2019-09-22 ENCOUNTER — Encounter: Admit: 2019-09-22 | Discharge: 2019-09-22 | Payer: MEDICARE

## 2019-09-22 DIAGNOSIS — C8332 Diffuse large B-cell lymphoma, intrathoracic lymph nodes: Secondary | ICD-10-CM

## 2019-09-22 DIAGNOSIS — I10 Essential (primary) hypertension: Secondary | ICD-10-CM

## 2019-09-22 DIAGNOSIS — C8590 Non-Hodgkin lymphoma, unspecified, unspecified site: Secondary | ICD-10-CM

## 2019-09-22 DIAGNOSIS — J9859 Other diseases of mediastinum, not elsewhere classified: Secondary | ICD-10-CM

## 2019-09-22 DIAGNOSIS — M48061 Spinal stenosis, lumbar region without neurogenic claudication: Secondary | ICD-10-CM

## 2019-09-22 DIAGNOSIS — M792 Neuralgia and neuritis, unspecified: Secondary | ICD-10-CM

## 2019-09-22 DIAGNOSIS — C801 Malignant (primary) neoplasm, unspecified: Secondary | ICD-10-CM

## 2019-09-22 DIAGNOSIS — R079 Chest pain, unspecified: Secondary | ICD-10-CM

## 2019-09-22 LAB — CBC AND DIFF
Lab: 0 10*3/uL (ref 0–0.20)
Lab: 3.9 10*3/uL — ABNORMAL LOW (ref 4.5–11.0)
Lab: 4.5 M/UL (ref 4.0–5.0)
Lab: 68 % (ref >60)
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
## 2019-09-23 LAB — COMPREHENSIVE METABOLIC PANEL
Lab: 0.6 mg/dL (ref 0.4–1.00)
Lab: 101 MMOL/L (ref 98–110)
Lab: 104 U/L (ref 25–110)
Lab: 12 K/UL — ABNORMAL LOW (ref 3–12)
Lab: 13 U/L (ref 7–56)
Lab: 140 MMOL/L (ref 137–147)
Lab: 17 U/L (ref 7–40)
Lab: 27 MMOL/L (ref 21–30)
Lab: 4.1 MMOL/L (ref 3.5–5.1)
Lab: 4.3 g/dL — ABNORMAL LOW (ref >60)
Lab: 7.4 g/dL — ABNORMAL LOW (ref 6.0–8.0)
Lab: 88 mg/dL — ABNORMAL LOW (ref 70–100)
Lab: 9 mg/dL (ref 7–25)
Lab: 9.9 mg/dL (ref 8.5–10.6)
Lab: >60 mL/min (ref >60)
Lab: >60 mL/min (ref >60)

## 2019-09-23 LAB — LDH-LACTATE DEHYDROGENASE: Lab: 205 U/L (ref 100–210)

## 2019-11-16 ENCOUNTER — Encounter: Admit: 2019-11-16 | Discharge: 2019-11-16 | Payer: MEDICARE

## 2019-11-16 DIAGNOSIS — Z981 Arthrodesis status: Secondary | ICD-10-CM

## 2019-11-16 DIAGNOSIS — M48062 Spinal stenosis, lumbar region with neurogenic claudication: Secondary | ICD-10-CM

## 2019-12-14 ENCOUNTER — Encounter: Admit: 2019-12-14 | Discharge: 2019-12-14 | Payer: MEDICARE

## 2019-12-15 ENCOUNTER — Encounter: Admit: 2019-12-15 | Discharge: 2019-12-15 | Payer: MEDICARE

## 2019-12-15 DIAGNOSIS — C8332 Diffuse large B-cell lymphoma, intrathoracic lymph nodes: Secondary | ICD-10-CM

## 2019-12-15 DIAGNOSIS — R079 Chest pain, unspecified: Secondary | ICD-10-CM

## 2019-12-15 DIAGNOSIS — M792 Neuralgia and neuritis, unspecified: Secondary | ICD-10-CM

## 2019-12-15 DIAGNOSIS — M48061 Spinal stenosis, lumbar region without neurogenic claudication: Secondary | ICD-10-CM

## 2019-12-15 DIAGNOSIS — C801 Malignant (primary) neoplasm, unspecified: Secondary | ICD-10-CM

## 2019-12-15 DIAGNOSIS — C8590 Non-Hodgkin lymphoma, unspecified, unspecified site: Secondary | ICD-10-CM

## 2019-12-15 DIAGNOSIS — I10 Essential (primary) hypertension: Secondary | ICD-10-CM

## 2019-12-15 LAB — CBC AND DIFF
Lab: 0 K/UL (ref 0–0.20)
Lab: 0.1 K/UL (ref 0–0.45)
Lab: 12 g/dL (ref 12.0–15.0)
Lab: 3.5 K/UL — ABNORMAL LOW (ref 4.5–11.0)
Lab: 37 % (ref 36–45)
Lab: 4.4 M/UL (ref 4.0–5.0)
Lab: 84 FL (ref 80–100)

## 2019-12-15 NOTE — Progress Notes
Name: Kara Andersen          MRN: [id number]      DOB: 1970-08-03      AGE: 49 y.o.   DATE OF SERVICE: 12/15/2019    Subjective:             Reason for Visit:  Follow Up      Kara Andersen is a 49 y.o. female.     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  She completed 6 cycles of R-CHOP with CR.      She underwent anterior/posterior lumbar instrumented fusion in April 2021.  Her pain is better.  Dr. Samara Deist is managing her pain medication.  She has not had a mammogram for over 10 years according to her.  She is on disability.  She is active with her dogs and walks with them.  Her activity is limited by her back pain.  Denies fevers or infectious symptoms.  She has noticed a lipoma in her mid back does not cause her any symptoms.  No GI or GU complaints.  No new adenopathy.  No night sweats.  Over the last couple weeks her appetite has been down but she has a cold. Left upper arm bug bites, no itching or pain.    HPI  1.  Presented May 2020 with bulky mediastinal mass.  2.  06/22/2018 core needle biopsy of mediastinal lesion revealed CD10 negative CD30 negative diffuse large B-cell lymphoma.  Both FISH and IHC for MYC were negative.    3.  06/23/2018 PET/CT revealed bulky hypermetabolic mediastinal mass along with lesions in the pleura and liver.  Staging bone marrow was negative.  4.  R-CHOP x 6 with CR.  She is alone.     Review of Systems   HENT:        Cold symptoms     Musculoskeletal:        Back pain is a little better         Objective:         ? cyclobenzaprine (FLEXERIL) 10 mg tablet Take one tablet by mouth at bedtime daily. ? docusate (COLACE) 100 mg capsule Take one capsule by mouth twice daily as needed for Constipation.   ? escitalopram oxalate (LEXAPRO) 10 mg tablet Take 10 mg by mouth daily.   ? gabapentin (NEURONTIN) 600 mg tablet Take one tablet by mouth three times daily.   ? HYDROcodone/acetaminophen (NORCO) 7.5/325 mg tablet Take one tablet by mouth every 6 hours as needed for Pain  Max 4 tabs/day   ? oxyCODONE-acetaminophen (PERCOCET) 7.5-325 mg tablet Take one tablet by mouth daily as needed for Pain   ? oxyCODONE-acetaminophen (PERCOCET) 7.5-325 mg tablet Take one tablet by mouth daily as needed for Pain   ? Potassium 99 mg tab Take 3 tablets by mouth daily.     There were no vitals filed for this visit.  There is no height or weight on file to calculate BMI.               Pain Addressed:  N/A  [place] Cooperative  Oncology Group performance status is 1, Restricted in physically strenuous activity but ambulatory and able to carry out work of a light or sedentary nature, e.g., light house work, office work.     Physical Exam  Vitals reviewed.   Constitutional:       Appearance: Normal appearance. She is well-developed.   HENT:      Head: Normocephalic.   Cardiovascular:      Rate and Rhythm: Normal rate and regular rhythm.   Pulmonary:      Effort: Pulmonary effort is normal.      Breath sounds: Normal breath sounds.   Abdominal:      Palpations: Abdomen is soft.   Musculoskeletal:         General: Normal range of motion.      Cervical back: Normal range of motion.   Lymphadenopathy:      Cervical: No cervical adenopathy.      Upper Body:      Right upper body: No supraclavicular or axillary adenopathy.      Left upper body: No supraclavicular or axillary adenopathy.   Skin:     General: Skin is warm and dry.   Neurological:      General: No focal deficit present.      Mental Status: She is alert and oriented to person, place, and time.   Psychiatric:         Mood and Affect: Mood normal.     Incision from back surgery completely healed.  0.5 cm lipoma felt in mid back.       CBC w/Diff    Lab Results   Component Value Date/Time    WBC 3.9 (L) 09/22/2019 12:36 PM    RBC 4.59 09/22/2019 12:36 PM    HGB 12.7 09/22/2019 12:36 PM    HCT 38.1 09/22/2019 12:36 PM    MCV 83.0 09/22/2019 12:36 PM    MCH 27.7 09/22/2019 12:36 PM    MCHC 33.4 09/22/2019 12:36 PM    RDW 14.7 09/22/2019 12:36 PM    PLTCT 229 09/22/2019 12:36 PM    MPV 6.9 (L) 09/22/2019 12:36 PM    Lab Results   Component Value Date/Time    NEUT 68 09/22/2019 12:36 PM    ANC 2.60 09/22/2019 12:36 PM    LYMA 20 (L) 09/22/2019 12:36 PM    ALC 0.80 (L) 09/22/2019 12:36 PM    MONA 9 09/22/2019 12:36 PM    AMC 0.30 09/22/2019 12:36 PM    EOSA 2 09/22/2019 12:36 PM    AEC 0.10 09/22/2019 12:36 PM    BASA 1 09/22/2019 12:36 PM    ABC 0.00 09/22/2019 12:36 PM             Assessment and Plan:      ?   1. 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  Her end of treatment PET scan on 11/11/2018 revealed complete remission.  She has no evidence of disease on exam today.  She will follow up with Dr. Mikey Bussing as scheduled in February 2022.  She will call in the interim with any problems or concerns.  2. We reviewed her CBC results today.  Her WBC has mildly decreased however her ANC is normal.  We will continue to monitor with routine labs.   3. She has a primary doctor, Dr. Maisie Fus.  She has not had a recent mammogram and I urged her to contact her local  facility to get a routine screening mammogram completed.  She does not routinely get the flu shot or any other vaccines.  She is not interested in getting the Covid vaccines.  We discussed healthy lifestyle behaviors including exercise.   4. 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  She underwent surgery in April 2021 with improvement with Dr. Jean Rosenthal.  She is on pain medication and Neurontin with relief. Dr. Samara Deist prescribing pain meds.    I

## 2019-12-20 ENCOUNTER — Ambulatory Visit: Admit: 2019-12-20 | Discharge: 2019-12-20 | Payer: MEDICARE

## 2019-12-20 ENCOUNTER — Encounter: Admit: 2019-12-20 | Discharge: 2019-12-20 | Payer: MEDICARE

## 2019-12-20 DIAGNOSIS — C8590 Non-Hodgkin lymphoma, unspecified, unspecified site: Secondary | ICD-10-CM

## 2019-12-20 DIAGNOSIS — M5116 Intervertebral disc disorders with radiculopathy, lumbar region: Secondary | ICD-10-CM

## 2019-12-20 DIAGNOSIS — I10 Essential (primary) hypertension: Secondary | ICD-10-CM

## 2019-12-20 DIAGNOSIS — M792 Neuralgia and neuritis, unspecified: Secondary | ICD-10-CM

## 2019-12-20 DIAGNOSIS — M5136 Other intervertebral disc degeneration, lumbar region: Secondary | ICD-10-CM

## 2019-12-20 DIAGNOSIS — Z981 Arthrodesis status: Secondary | ICD-10-CM

## 2019-12-20 DIAGNOSIS — R079 Chest pain, unspecified: Secondary | ICD-10-CM

## 2019-12-20 DIAGNOSIS — M48062 Spinal stenosis, lumbar region with neurogenic claudication: Secondary | ICD-10-CM

## 2019-12-20 DIAGNOSIS — M48061 Spinal stenosis, lumbar region without neurogenic claudication: Secondary | ICD-10-CM

## 2019-12-20 DIAGNOSIS — C801 Malignant (primary) neoplasm, unspecified: Secondary | ICD-10-CM

## 2019-12-20 DIAGNOSIS — M5416 Radiculopathy, lumbar region: Secondary | ICD-10-CM

## 2019-12-20 MED ORDER — OXYCODONE-ACETAMINOPHEN 7.5-325 MG PO TAB
1 | ORAL_TABLET | Freq: Two times a day (BID) | ORAL | 0 refills | 2.00000 days | Status: AC | PRN
Start: 2019-12-20 — End: ?

## 2019-12-20 MED ORDER — OXYCODONE-ACETAMINOPHEN 7.5-325 MG PO TAB
1 | ORAL_TABLET | Freq: Two times a day (BID) | ORAL | 0 refills | 2.00000 days | Status: AC | PRN
Start: 2019-12-20 — End: ?
  Filled 2019-12-20: qty 45, 23d supply, fill #1

## 2019-12-20 NOTE — Patient Instructions
It was nice to see you today. Thank you for choosing to visit our clinic.      Your time is important and if you had to wait today, we do apologize. Our goal is to run exactly on time; however, on occasion, we get behind in clinic due to unexpected patient issues. Thank you for your patience.    General Instructions:   How to reach me: Please send a MyChart message to the [facility] or leave a voicemail for my nurse, [place], at [phone].   How to get a medication refill: Please use the MyChart Refill request or contact your pharmacy directly to request medication refills. Please allow 72 business hours for request to be completed.   How to receive your test results: If you have signed up for MyChart, you will receive your test results and messages from me this way. Otherwise, you will get a phone call or letter. If you are expecting results and have not heard from my office within 2 weeks of your testing, please send a MyChart message or call my office.   Scheduling: Our scheduling phone number is [phone].   Appointment Reminders on your cell phone: Make sure we have your cell phone number, and Text [place] to [phone].   Support for many chronic illnesses is available through [facility]: [facility] or [phone].   For questions on nights, weekends or holidays, call the Operator at [phone], and ask for the doctor on call for Anesthesia Pain Management.      Again, thank you for coming in today.    ________________________________________________________________

## 2019-12-20 NOTE — Progress Notes
[facility] CLINIC NOTE       SUBJECTIVE: Ms. Kara Andersen presents in follow-up for ongoing care regarding back and lower extremity pain.  For pain relief she is taking oxycodone 7.5-325 mg once to twice per day but not for the past month.  She continues to perform home exercise plan as prescribed by physical therapy.  Walking standing bending exacerbates her pain.  Pain is improved with rest.         Review of Systems    Current Outpatient Medications:   ?  cyclobenzaprine (FLEXERIL) 10 mg tablet, Take one tablet by mouth at bedtime daily., Disp: 90 tablet, Rfl: 3  ?  docusate (COLACE) 100 mg capsule, Take one capsule by mouth twice daily as needed for Constipation., Disp: 180 capsule, Rfl: 0  ?  escitalopram oxalate (LEXAPRO) 10 mg tablet, Take 10 mg by mouth daily., Disp: , Rfl:   ?  gabapentin (NEURONTIN) 600 mg tablet, Take one tablet by mouth three times daily., Disp: 90 tablet, Rfl: 11  ?  HYDROcodone/acetaminophen (NORCO) 7.5/325 mg tablet, Take one tablet by mouth every 6 hours as needed for Pain  Max 4 tabs/day, Disp: 80 tablet, Rfl: 0  ?  oxyCODONE-acetaminophen (PERCOCET) 7.5-325 mg tablet, Take one tablet by mouth every 12 hours as needed for Pain, Disp: 45 tablet, Rfl: 0  ?  [START ON 01/19/2020] oxyCODONE-acetaminophen (PERCOCET) 7.5-325 mg tablet, Take one tablet by mouth every 12 hours as needed for Pain, Disp: 45 tablet, Rfl: 0  ?  [START ON 02/18/2020] oxyCODONE-acetaminophen (PERCOCET) 7.5-325 mg tablet, Take one tablet by mouth every 12 hours as needed for Pain, Disp: 45 tablet, Rfl: 0  ?  oxyCODONE-acetaminophen (PERCOCET) 7.5-325 mg tablet, Take one tablet by mouth daily as needed for Pain, Disp: 30 tablet, Rfl: 0  ?  Potassium 99 mg tab, Take 3 tablets by mouth daily., Disp: , Rfl:   Allergies   Allergen Reactions   ? Other [Unclassified Drug] HIVES     Pt stated had a reaction to a migraine medication but doesn't remember the name.     Physical Exam  Vitals:    12/20/19 1445   BP: 138/82   Pulse: 101   SpO2: 98%   Weight: 63 kg (139 lb)   PainSc: Five        Pain Score: Five  Body mass index is 23.13 kg/m?Marland Kitchen  General: Alert and oriented, very pleasant female.   HEENT showed extraocular muscles were intact and no other abnormalities.  Unlabored breathing.  Regular rate and rhythm on CV exam.   5/5 strength in bilateral upper and lower extremities.    Sensation is intact to light touch and equal in the upper and lower extremities.  There is bilateral lumbar tenderness to palpation       IMPRESSION:  1. Lumbar disc disease with radiculopathy    2. Lumbar radicular pain          PLAN: Well refill oxycodone 7.5-325 mg 1 tablet p.o. twice daily as needed #45, representing a 30-day supply.  Continue with home exercise plan as prescribed by physical therapy and schedule a 4-month clinic follow-up or sooner if needed.

## 2019-12-25 IMAGING — CT ABDOMEN_PELVIS WO(Adult)
1 series · 2 of 2 positions shown, 5 images · non-contrast
Comparison: none

[Series 2: abdomen ax 5.00 br40 s3 · axial · 0.45mm/px · z∈[+1764,+1794]mm · 2 of 2 slices shown, 5 images]
[im 1/2  soft-tissue]
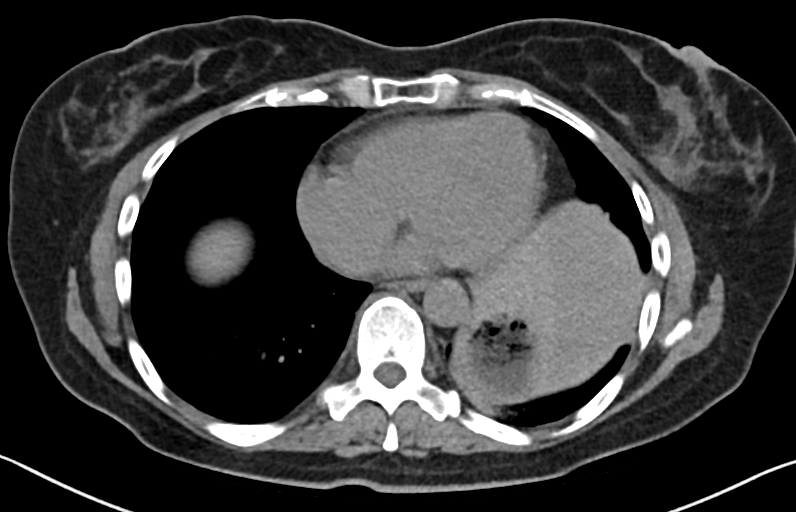
[im 1/2  lung]
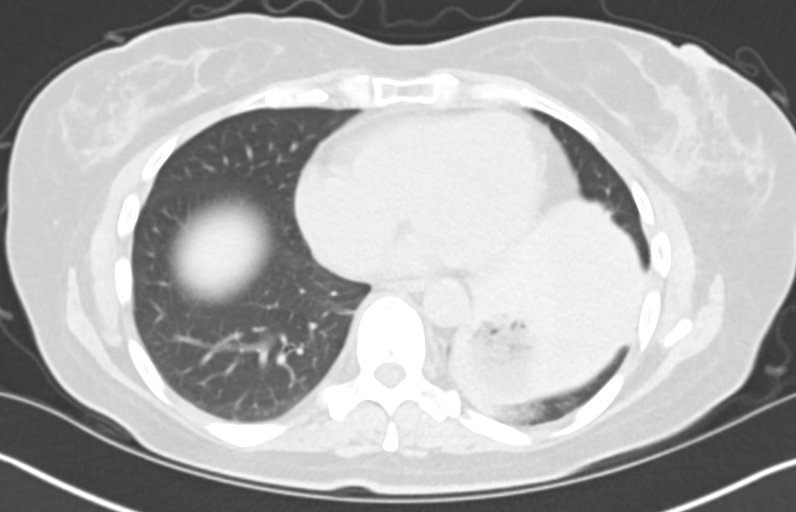
[im 1/2  bone]
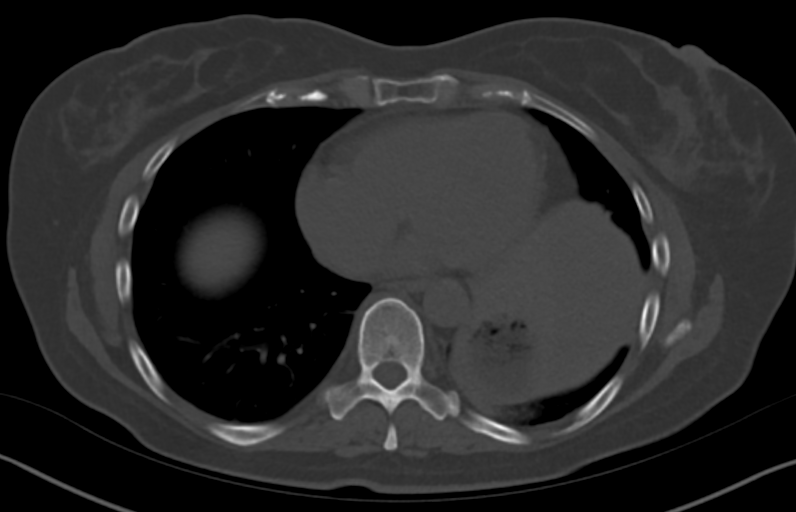
[im 2/2  soft-tissue]
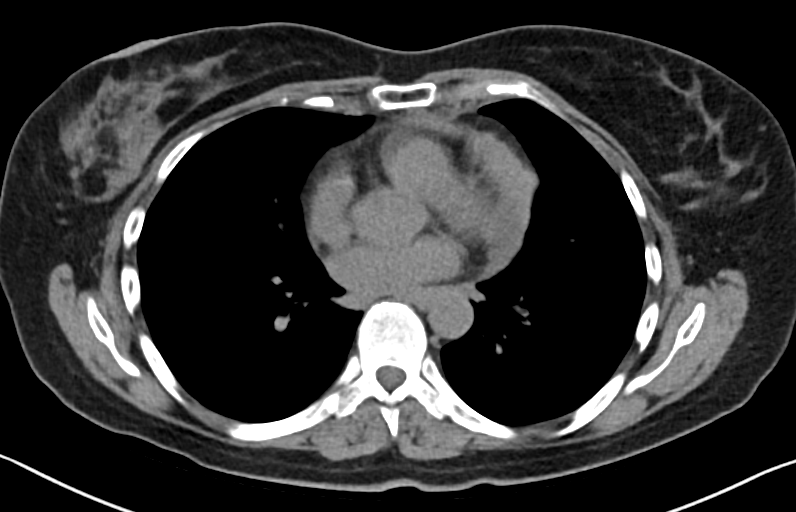
[im 2/2  lung]
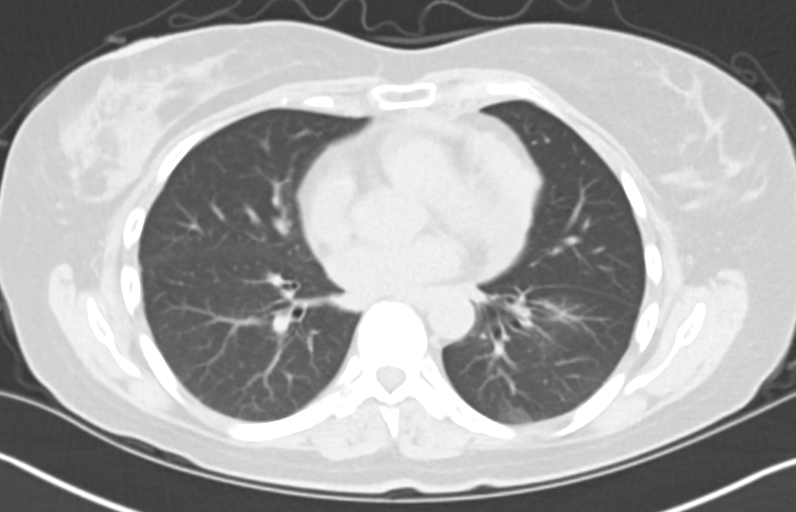

[2 of 2 positions shown; findings below may reference images not displayed]

DIAGNOSTIC STUDIES

EXAM
COMPUTED TOMOGRAPHY, ABDOMEN AND PELVIS; WITHOUT CONTRAST MATERIAL CPT 37433

INDICATION
left flank pain
Pt c/o left flank pain x 1 week. H/o hysterectomy and csection x 4. CT/NM 0/0. CC

TECHNIQUE
Contiguous axial tomographic images were obtained from the lung bases to the symphysis pubis
without contrast.
All CT scans at this facility use dose modulation, iterative reconstruction, and/or weight based
dosing when appropriate to reduce radiation dose to as low as reasonably achievable.

COMPARISONS
No priors available for comparison.

FINDINGS
[Elevation of the left hemidiaphragm. Compressive atelectasis versus consolidation within the left
lower lobe. The liver, spleen,, gallbladder and adrenal glands appear grossly unremarkable. No
hydronephrosis. No renal calculi. No bowel obstruction or free air. Moderate amount of stool. No
aortic aneurysm. Subcentimeter retroperitoneal nodes. The urinary bladder is incompletely distended.
Hysterectomy. The adnexa appear grossly unremarkable. Moderate degenerative changes at the level of

IMPRESSION
1. Elevation of the left hemidiaphragm with compressive atelectasis versus consolidation within the
left lower lobe.
2. No hydronephrosis or renal calculi.

Tech Notes:

Pt c/o left flank pain x 1 week. H/o hysterectomy and csection x 4. CT/NM 0/0. CC

## 2020-02-17 ENCOUNTER — Encounter: Admit: 2020-02-17 | Discharge: 2020-02-17 | Payer: MEDICARE

## 2020-02-17 MED ORDER — CYCLOBENZAPRINE 10 MG PO TAB
10 mg | ORAL_TABLET | Freq: Every evening | ORAL | 3 refills | 30.00000 days | Status: AC
Start: 2020-02-17 — End: ?

## 2020-02-17 NOTE — Telephone Encounter
Patient requesting refill of flexeril  Last Office Visit 12/20/19  Next Office Visit 03/23/20  Required labs NA  [facility] last filled NA

## 2020-03-23 ENCOUNTER — Encounter: Admit: 2020-03-23 | Discharge: 2020-03-23 | Payer: MEDICARE

## 2020-03-23 ENCOUNTER — Ambulatory Visit: Admit: 2020-03-23 | Discharge: 2020-03-24 | Payer: MEDICARE

## 2020-03-23 DIAGNOSIS — M48061 Spinal stenosis, lumbar region without neurogenic claudication: Secondary | ICD-10-CM

## 2020-03-23 DIAGNOSIS — M792 Neuralgia and neuritis, unspecified: Secondary | ICD-10-CM

## 2020-03-23 DIAGNOSIS — M5116 Intervertebral disc disorders with radiculopathy, lumbar region: Secondary | ICD-10-CM

## 2020-03-23 DIAGNOSIS — I10 Essential (primary) hypertension: Secondary | ICD-10-CM

## 2020-03-23 DIAGNOSIS — C801 Malignant (primary) neoplasm, unspecified: Secondary | ICD-10-CM

## 2020-03-23 DIAGNOSIS — R079 Chest pain, unspecified: Secondary | ICD-10-CM

## 2020-03-23 DIAGNOSIS — M5416 Radiculopathy, lumbar region: Secondary | ICD-10-CM

## 2020-03-23 DIAGNOSIS — C8590 Non-Hodgkin lymphoma, unspecified, unspecified site: Secondary | ICD-10-CM

## 2020-03-23 MED ORDER — OXYCODONE-ACETAMINOPHEN 7.5-325 MG PO TAB
1 | ORAL_TABLET | Freq: Two times a day (BID) | ORAL | 0 refills | 2.00000 days | Status: AC | PRN
Start: 2020-03-23 — End: ?

## 2020-03-24 ENCOUNTER — Encounter: Admit: 2020-03-24 | Discharge: 2020-03-24 | Payer: MEDICARE

## 2020-03-24 MED ORDER — OXYCODONE-ACETAMINOPHEN 7.5-325 MG PO TAB
1 | ORAL_TABLET | Freq: Two times a day (BID) | ORAL | 0 refills | 2.00000 days | Status: AC | PRN
Start: 2020-03-24 — End: ?

## 2020-03-24 NOTE — Telephone Encounter
Patient requesting refill of oxycodone  Last Office Visit 03/23/20  Next Office Visit NA  Required labs NA  [facility] last filled 02/20/20

## 2020-04-05 ENCOUNTER — Encounter: Admit: 2020-04-05 | Discharge: 2020-04-05 | Payer: MEDICARE

## 2020-04-05 NOTE — Telephone Encounter
Kara Andersen called to reschedule her no show appointment from 03/03.  Sent to schedulers.

## 2020-04-30 ENCOUNTER — Encounter: Admit: 2020-04-30 | Discharge: 2020-04-30 | Payer: MEDICARE

## 2020-04-30 MED ORDER — GABAPENTIN 600 MG PO TAB
ORAL_TABLET | Freq: Three times a day (TID) | 0 refills
Start: 2020-04-30 — End: ?

## 2020-05-12 ENCOUNTER — Encounter: Admit: 2020-05-12 | Discharge: 2020-05-12 | Payer: MEDICARE

## 2020-05-12 DIAGNOSIS — I10 Essential (primary) hypertension: Secondary | ICD-10-CM

## 2020-05-12 DIAGNOSIS — R079 Chest pain, unspecified: Secondary | ICD-10-CM

## 2020-05-12 DIAGNOSIS — D709 Neutropenia, unspecified: Secondary | ICD-10-CM

## 2020-05-12 DIAGNOSIS — M792 Neuralgia and neuritis, unspecified: Secondary | ICD-10-CM

## 2020-05-12 DIAGNOSIS — C801 Malignant (primary) neoplasm, unspecified: Secondary | ICD-10-CM

## 2020-05-12 DIAGNOSIS — C8590 Non-Hodgkin lymphoma, unspecified, unspecified site: Secondary | ICD-10-CM

## 2020-05-12 DIAGNOSIS — C8332 Diffuse large B-cell lymphoma, intrathoracic lymph nodes: Secondary | ICD-10-CM

## 2020-05-12 DIAGNOSIS — M48061 Spinal stenosis, lumbar region without neurogenic claudication: Secondary | ICD-10-CM

## 2020-05-12 LAB — COMPREHENSIVE METABOLIC PANEL
Lab: 0.4 mg/dL (ref 0.3–1.2)
Lab: 0.7 mg/dL (ref 0.4–1.00)
Lab: 10 U/L (ref 7–56)
Lab: 100 U/L (ref 25–110)
Lab: 103 MMOL/L (ref 98–110)
Lab: 12 U/L (ref 7–40)
Lab: 142 MMOL/L (ref 137–147)
Lab: 3.8 MMOL/L (ref 3.5–5.1)
Lab: 32 MMOL/L — ABNORMAL HIGH (ref 21–30)
Lab: 4.2 g/dL (ref 3.5–5.0)
Lab: 6.5 g/dL (ref 6.0–8.0)
Lab: 67 mg/dL — ABNORMAL LOW (ref 70–100)
Lab: 7 K/UL — ABNORMAL LOW (ref 3–12)
Lab: 7 mg/dL (ref 7–25)
Lab: 9.3 mg/dL (ref 8.5–10.6)
Lab: >60 mL/min (ref >60)

## 2020-05-12 LAB — CBC AND DIFF
Lab: 0 K/UL (ref 0–0.20)
Lab: 0.1 K/UL (ref 0–0.45)
Lab: 3.3 K/UL — ABNORMAL LOW (ref 4.5–11.0)
Lab: 4.3 M/UL (ref 4.0–5.0)

## 2020-05-12 LAB — LDH-LACTATE DEHYDROGENASE: Lab: 126 U/L (ref 100–210)
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
## 2020-06-01 ENCOUNTER — Encounter: Admit: 2020-06-01 | Discharge: 2020-06-01 | Payer: MEDICARE

## 2020-06-01 MED ORDER — GABAPENTIN 600 MG PO TAB
ORAL_TABLET | Freq: Three times a day (TID) | 0 refills
Start: 2020-06-01 — End: ?

## 2020-06-19 ENCOUNTER — Encounter: Admit: 2020-06-19 | Discharge: 2020-06-19 | Payer: MEDICARE

## 2020-06-19 MED ORDER — OXYCODONE-ACETAMINOPHEN 7.5-325 MG PO TAB
1 | ORAL_TABLET | Freq: Two times a day (BID) | ORAL | 0 refills | 2.00000 days | Status: AC | PRN
Start: 2020-06-19 — End: ?

## 2020-06-19 NOTE — Telephone Encounter
Patient requesting refill of oxycodone  Last Office Visit 03/23/20  Next Office Visit 06/29/20  Required labs NA  [facility] last filled 05/20/20

## 2020-06-29 ENCOUNTER — Encounter: Admit: 2020-06-29 | Discharge: 2020-06-29 | Payer: MEDICARE

## 2020-06-29 DIAGNOSIS — M48061 Spinal stenosis, lumbar region without neurogenic claudication: Secondary | ICD-10-CM

## 2020-06-29 DIAGNOSIS — C8590 Non-Hodgkin lymphoma, unspecified, unspecified site: Secondary | ICD-10-CM

## 2020-06-29 DIAGNOSIS — R079 Chest pain, unspecified: Secondary | ICD-10-CM

## 2020-06-29 DIAGNOSIS — I10 Essential (primary) hypertension: Secondary | ICD-10-CM

## 2020-06-29 DIAGNOSIS — C801 Malignant (primary) neoplasm, unspecified: Secondary | ICD-10-CM

## 2020-06-29 DIAGNOSIS — M792 Neuralgia and neuritis, unspecified: Secondary | ICD-10-CM

## 2020-06-30 ENCOUNTER — Encounter: Admit: 2020-06-30 | Discharge: 2020-06-30 | Payer: MEDICARE

## 2020-07-04 ENCOUNTER — Encounter: Admit: 2020-07-04 | Discharge: 2020-07-04 | Payer: MEDICARE

## 2020-07-04 DIAGNOSIS — R920 Mammographic microcalcification found on diagnostic imaging of breast: Secondary | ICD-10-CM

## 2020-07-13 ENCOUNTER — Encounter: Admit: 2020-07-13 | Discharge: 2020-07-13 | Payer: MEDICARE

## 2020-07-13 ENCOUNTER — Ambulatory Visit: Admit: 2020-07-13 | Discharge: 2020-07-13 | Payer: MEDICARE

## 2020-07-13 DIAGNOSIS — R920 Mammographic microcalcification found on diagnostic imaging of breast: Secondary | ICD-10-CM

## 2020-07-13 MED ORDER — LIDOCAINE 1%-EPINEPHRINE 1:100000 (BUFFERED) VIAL (BREAST CENTER)
3 mL | Freq: Once | INTRAMUSCULAR | 0 refills | Status: CP
Start: 2020-07-13 — End: ?

## 2020-07-13 MED ORDER — LIDOCAINE 1% (BUFFERED) SYRINGE (BREAST CENTER)
.5 mL | Freq: Once | INTRAMUSCULAR | 0 refills | Status: CP
Start: 2020-07-13 — End: ?

## 2020-07-13 MED ORDER — LIDOCAINE-EPINEPHRINE 1 %-1:100,000 IJ SOLN
10 mL | Freq: Once | INTRAMUSCULAR | 0 refills | Status: CP
Start: 2020-07-13 — End: ?
  Administered 2020-07-13: 20:00:00 10 mL via INTRAMUSCULAR

## 2020-07-13 NOTE — Progress Notes
Kara Andersen is here for a right breast stereotactic breast biopsy today. She has been educated, risk of procedure were discussed and her questions answered prior to signing procedure consent with Dr. Rollene Fare and this nurse present.

## 2020-07-20 ENCOUNTER — Encounter: Admit: 2020-07-20 | Discharge: 2020-07-20 | Payer: MEDICARE

## 2020-07-20 MED ORDER — OXYCODONE-ACETAMINOPHEN 7.5-325 MG PO TAB
1 | ORAL_TABLET | Freq: Two times a day (BID) | ORAL | 0 refills | 2.00000 days | Status: AC | PRN
Start: 2020-07-20 — End: ?

## 2020-07-20 NOTE — Telephone Encounter
Patient requesting refill of oxycodone  Last Office Visit 03/23/20  Next Office Visit 08/17/20  Required labs NA  [facility] last filled 06/20/20

## 2020-07-26 ENCOUNTER — Encounter: Admit: 2020-07-26 | Discharge: 2020-07-26 | Payer: MEDICARE

## 2020-07-26 NOTE — Progress Notes
I spoke with Kara Andersen and informed her of the Radiologists Concordance report and future breast imaging recommendation from her breast biopsy on 07/13/2020.  Radiologist has recommended that she have a left breast ultrasound in 6 months.  She verbalized understanding and did not have any questions.

## 2020-07-28 ENCOUNTER — Encounter: Admit: 2020-07-28 | Discharge: 2020-07-28 | Payer: MEDICARE

## 2020-07-28 MED ORDER — GABAPENTIN 600 MG PO TAB
600 mg | ORAL_TABLET | Freq: Three times a day (TID) | ORAL | 0 refills | Status: AC
Start: 2020-07-28 — End: ?

## 2020-07-28 NOTE — Telephone Encounter
Patient requesting refill of gabapentin  Last Office Visit 03/23/20  Next Office Visit 08/17/20  Required labs NA  [facility] last filled NA

## 2020-08-17 ENCOUNTER — Encounter: Admit: 2020-08-17 | Discharge: 2020-08-17 | Payer: MEDICARE

## 2020-08-17 ENCOUNTER — Ambulatory Visit: Admit: 2020-08-17 | Discharge: 2020-08-17 | Payer: MEDICARE

## 2020-08-17 DIAGNOSIS — M48061 Spinal stenosis, lumbar region without neurogenic claudication: Secondary | ICD-10-CM

## 2020-08-17 DIAGNOSIS — C801 Malignant (primary) neoplasm, unspecified: Secondary | ICD-10-CM

## 2020-08-17 DIAGNOSIS — I10 Essential (primary) hypertension: Secondary | ICD-10-CM

## 2020-08-17 DIAGNOSIS — C8590 Non-Hodgkin lymphoma, unspecified, unspecified site: Secondary | ICD-10-CM

## 2020-08-17 DIAGNOSIS — R079 Chest pain, unspecified: Secondary | ICD-10-CM

## 2020-08-17 DIAGNOSIS — M792 Neuralgia and neuritis, unspecified: Secondary | ICD-10-CM

## 2020-08-17 DIAGNOSIS — M5416 Radiculopathy, lumbar region: Secondary | ICD-10-CM

## 2020-08-17 MED ORDER — OXYCODONE-ACETAMINOPHEN 7.5-325 MG PO TAB
1 | ORAL_TABLET | Freq: Two times a day (BID) | ORAL | 0 refills | 2.00000 days | Status: AC | PRN
Start: 2020-08-17 — End: ?

## 2020-08-17 MED ORDER — GABAPENTIN 600 MG PO TAB
600 mg | ORAL_TABLET | Freq: Three times a day (TID) | ORAL | 11 refills | Status: AC
Start: 2020-08-17 — End: ?

## 2020-08-17 NOTE — Patient Instructions
It was nice to see you today. Thank you for choosing to visit our clinic.      Your time is important and if you had to wait today, we do apologize. Our goal is to run exactly on time; however, on occasion, we get behind in clinic due to unexpected patient issues. Thank you for your patience.    General Instructions:   How to reach me: Please send a MyChart message to the [facility] or leave a voicemail for my nurse, Cara , at [phone].   How to get a medication refill: Please use the MyChart Refill request or contact your pharmacy directly to request medication refills. Please allow 72 business hours for request to be completed.   How to receive your test results: If you have signed up for MyChart, you will receive your test results and messages from me this way. Otherwise, you will get a phone call or letter. If you are expecting results and have not heard from my office within 2 weeks of your testing, please send a MyChart message or call my office.   Scheduling: Our scheduling phone number is [phone].   Appointment Reminders on your cell phone: Communication preferences can be managed in MyChart to ensure you receive important appointment notifications.   Support for many chronic illnesses is available through [facility]: [facility] or [phone].   For questions on nights, weekends or holidays, call the Operator at [phone], and ask for the doctor on call for [facility].      Again, thank you for coming in today.    ________________________________________________________________

## 2020-08-17 NOTE — Progress Notes
[facility] CLINIC NOTE       SUBJECTIVE: Ms. Kara Andersen presents for care regarding back and lower extremity pain.  Pain is bilateral intermittently sharp and dull.  She is status post lumbar spine fusion at L4-5.  Her pain is in the back extending in the lateral aspect of the lower extremities to the knees.  Walking and standing exacerbates pain.  Pain is improved with rest.  Lumbar TFE's have reduced pain by about 50% for about 3 months at a time she wishes I will repeated.  For pain relief she takes oxycodone 7.5-325 mg once to twice per day.         Review of Systems    Current Outpatient Medications:   ?  cyclobenzaprine (FLEXERIL) 10 mg tablet, Take one tablet by mouth at bedtime daily., Disp: 90 tablet, Rfl: 3  ?  docusate (COLACE) 100 mg capsule, Take one capsule by mouth twice daily as needed for Constipation., Disp: 180 capsule, Rfl: 0  ?  escitalopram oxalate (LEXAPRO) 10 mg tablet, Take 10 mg by mouth daily., Disp: , Rfl:   ?  gabapentin (NEURONTIN) 600 mg tablet, Take one tablet by mouth three times daily., Disp: 90 tablet, Rfl: 11  ?  [START ON 08/19/2020] oxyCODONE-acetaminophen (PERCOCET) 7.5-325 mg tablet, Take one tablet by mouth every 12 hours as needed for Pain. Do not start before August 19, 2020., Disp: 45 tablet, Rfl: 0  ?  [START ON 09/18/2020] oxyCODONE-acetaminophen (PERCOCET) 7.5-325 mg tablet, Take one tablet by mouth every 12 hours as needed for Pain. Do not start before September 18, 2020., Disp: 45 tablet, Rfl: 0  ?  [START ON 10/18/2020] oxyCODONE-acetaminophen (PERCOCET) 7.5-325 mg tablet, Take one tablet by mouth every 12 hours as needed for Pain. Do not start before October 18, 2020., Disp: 45 tablet, Rfl: 0  ?  oxyCODONE-acetaminophen (PERCOCET) 7.5-325 mg tablet, Take one tablet by mouth every 12 hours as needed for Pain., Disp: 45 tablet, Rfl: 0  ?  Potassium 99 mg tab, Take 3 tablets by mouth daily., Disp: , Rfl:   Allergies   Allergen Reactions   ? Other [Unclassified Drug] HIVES Pt stated had a reaction to a migraine medication but doesn't remember the name.     Physical Exam  Vitals:    08/17/20 1448   BP: 125/84   Pulse: 96   SpO2: 97%   PainSc: Seven   Weight: 63 kg (139 lb)   Height: 165.1 cm (5' 5)        Pain Score: Seven  Body mass index is 23.13 kg/m?Marland Kitchen  General: Alert and oriented, very pleasant female.   HEENT showed extraocular muscles were intact and no other abnormalities.  Unlabored breathing.  Regular rate and rhythm on CV exam.   5/5 strength in bilateral upper and lower extremities.    Sensation is intact to light touch and equal in the upper and lower extremities.  There is bilateral lumbar tenderness to palpation         IMPRESSION:  1. Lumbar radiculopathy          PLAN: We will schedule a bilateral L4-5 transforaminal epidural steroid injection and refill oxycodone and gabapentin as described above

## 2020-10-06 ENCOUNTER — Encounter: Admit: 2020-10-06 | Discharge: 2020-10-06 | Payer: MEDICARE

## 2020-10-06 DIAGNOSIS — M5116 Intervertebral disc disorders with radiculopathy, lumbar region: Secondary | ICD-10-CM

## 2020-10-06 DIAGNOSIS — M5416 Radiculopathy, lumbar region: Secondary | ICD-10-CM

## 2020-10-09 ENCOUNTER — Encounter: Admit: 2020-10-09 | Discharge: 2020-10-09 | Payer: MEDICARE

## 2020-11-15 ENCOUNTER — Encounter: Admit: 2020-11-15 | Discharge: 2020-11-15 | Payer: MEDICARE

## 2020-11-15 MED ORDER — OXYCODONE-ACETAMINOPHEN 7.5-325 MG PO TAB
1 | ORAL_TABLET | Freq: Two times a day (BID) | ORAL | 0 refills | 2.00000 days | Status: AC | PRN
Start: 2020-11-15 — End: ?

## 2020-11-15 NOTE — Telephone Encounter
Patient requesting refill of oxycodone  Last Office Visit 08/17/20  Next Office Visit 11/30/20  Required labs NA  [facility] last filled 10/19/20

## 2020-11-30 ENCOUNTER — Ambulatory Visit: Admit: 2020-11-30 | Discharge: 2020-11-30 | Payer: MEDICARE

## 2020-11-30 ENCOUNTER — Encounter: Admit: 2020-11-30 | Discharge: 2020-11-30 | Payer: MEDICARE

## 2020-11-30 DIAGNOSIS — M5116 Intervertebral disc disorders with radiculopathy, lumbar region: Secondary | ICD-10-CM

## 2020-11-30 DIAGNOSIS — M5416 Radiculopathy, lumbar region: Secondary | ICD-10-CM

## 2020-11-30 MED ORDER — OXYCODONE-ACETAMINOPHEN 7.5-325 MG PO TAB
1 | ORAL_TABLET | Freq: Two times a day (BID) | ORAL | 0 refills | 2.00000 days | Status: AC | PRN
Start: 2020-11-30 — End: ?

## 2020-11-30 MED ORDER — NALOXONE 4 MG/ACTUATION NA SPRY
NASAL | 1 refills | 1.00000 days | Status: AC | PRN
Start: 2020-11-30 — End: ?

## 2020-11-30 NOTE — Progress Notes
[facility] CLINIC NOTE       SUBJECTIVE: Kara Andersen is in follow-up for ongoing care regarding back and lower extremity pain.  Her pain is bilateral and intermittently sharp and dull.    Pain relief she is taking Percocet 7.5-325 mg once to twice per day.  Walking and standing exacerbates her pain.  Pain is improved with rest.  She continues to perform home exercise plan as prescribed by physical therapy         Review of Systems    Current Outpatient Medications:   ?  cyclobenzaprine (FLEXERIL) 10 mg tablet, Take one tablet by mouth at bedtime daily., Disp: 90 tablet, Rfl: 3  ?  docusate (COLACE) 100 mg capsule, Take one capsule by mouth twice daily as needed for Constipation., Disp: 180 capsule, Rfl: 0  ?  escitalopram oxalate (LEXAPRO) 10 mg tablet, Take 10 mg by mouth daily., Disp: , Rfl:   ?  gabapentin (NEURONTIN) 600 mg tablet, Take one tablet by mouth three times daily., Disp: 90 tablet, Rfl: 11  ?  naloxone (NARCAN) 4 mg/actuation nasal spray, Insert 1 spray into 1 nostril as needed for signs of opioid overdose then call 911. May repeat dose every 2-3 minutes (alternate nostrils) until medical team arrives.  Indications: opioid overdose, Disp: 2 each, Rfl: 1  ?  [START ON 12/18/2020] oxyCODONE-acetaminophen (PERCOCET) 7.5-325 mg tablet, Take one tablet by mouth every 12 hours as needed for Pain. Do not start before December 18, 2020., Disp: 45 tablet, Rfl: 0  ?  [START ON 01/17/2021] oxyCODONE-acetaminophen (PERCOCET) 7.5-325 mg tablet, Take one tablet by mouth every 12 hours as needed for Pain. Do not start before January 17, 2021., Disp: 45 tablet, Rfl: 0  ?  [START ON 02/16/2021] oxyCODONE-acetaminophen (PERCOCET) 7.5-325 mg tablet, Take one tablet by mouth every 12 hours as needed for Pain. Do not start before February 16, 2021., Disp: 45 tablet, Rfl: 0  ?  oxyCODONE-acetaminophen (PERCOCET) 7.5-325 mg tablet, Take one tablet by mouth every 12 hours as needed for Pain. Do not start before November 18, 2020., Disp: 45 tablet, Rfl: 0  ?  oxyCODONE-acetaminophen (PERCOCET) 7.5-325 mg tablet, Take one tablet by mouth every 12 hours as needed for Pain. Do not start before September 18, 2020., Disp: 45 tablet, Rfl: 0  ?  oxyCODONE-acetaminophen (PERCOCET) 7.5-325 mg tablet, Take one tablet by mouth every 12 hours as needed for Pain., Disp: 45 tablet, Rfl: 0  ?  Potassium 99 mg tab, Take 3 tablets by mouth daily., Disp: , Rfl:   Allergies   Allergen Reactions   ? Other [Unclassified Drug] HIVES     Pt stated had a reaction to a migraine medication but doesn't remember the name.     Physical Exam  Vitals:    11/30/20 1443   BP: 138/87   BP Source: Arm, Left Upper   Pulse: 86   SpO2: 99%   PainSc: Four   Weight: 62.6 kg (138 lb)   Height: 165.1 cm (5' 5)        Pain Score: Four  Body mass index is 22.96 kg/m?Marland Kitchen  General: Alert and oriented, very pleasant female.   HEENT showed extraocular muscles were intact and no other abnormalities.  Unlabored breathing.  Regular rate and rhythm on CV exam.   5/5 strength in bilateral upper and lower extremities.    Sensation is intact to light touch and equal in the upper and lower extremities.  There is bilateral lumbar tenderness to  palpation         IMPRESSION:  1. Lumbar disc disease with radiculopathy    2. Lumbar radicular pain          PLAN: We will continue with home exercise plan and schedule a 49-month clinic follow-up.  We will continue with Percocet as described above.

## 2021-03-05 ENCOUNTER — Ambulatory Visit: Admit: 2021-03-05 | Discharge: 2021-03-06 | Payer: MEDICARE

## 2021-03-05 ENCOUNTER — Encounter: Admit: 2021-03-05 | Discharge: 2021-03-05 | Payer: MEDICARE

## 2021-03-05 DIAGNOSIS — M5416 Radiculopathy, lumbar region: Secondary | ICD-10-CM

## 2021-03-05 DIAGNOSIS — M5116 Intervertebral disc disorders with radiculopathy, lumbar region: Secondary | ICD-10-CM
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
## 2021-03-19 ENCOUNTER — Encounter: Admit: 2021-03-19 | Discharge: 2021-03-19 | Payer: MEDICARE

## 2021-03-19 MED ORDER — OXYCODONE-ACETAMINOPHEN 7.5-325 MG PO TAB
1 | ORAL_TABLET | Freq: Two times a day (BID) | ORAL | 0 refills | 2.00000 days | Status: AC | PRN
Start: 2021-03-19 — End: ?

## 2021-03-19 NOTE — Telephone Encounter
Patient requesting refill of oxycodone  Last Office Visit 03/05/21  Next Office Visit NA  Required labs NA  [facility] last filled 02/16/21

## 2021-05-11 ENCOUNTER — Encounter: Admit: 2021-05-11 | Discharge: 2021-05-11 | Payer: MEDICARE

## 2021-05-11 DIAGNOSIS — C801 Malignant (primary) neoplasm, unspecified: Secondary | ICD-10-CM

## 2021-05-11 DIAGNOSIS — C8332 Diffuse large B-cell lymphoma, intrathoracic lymph nodes: Secondary | ICD-10-CM

## 2021-05-11 DIAGNOSIS — C8590 Non-Hodgkin lymphoma, unspecified, unspecified site: Secondary | ICD-10-CM

## 2021-05-11 DIAGNOSIS — M792 Neuralgia and neuritis, unspecified: Secondary | ICD-10-CM

## 2021-05-11 DIAGNOSIS — M48061 Spinal stenosis, lumbar region without neurogenic claudication: Secondary | ICD-10-CM

## 2021-05-11 DIAGNOSIS — R079 Chest pain, unspecified: Secondary | ICD-10-CM

## 2021-05-11 DIAGNOSIS — R9389 Abnormal findings on diagnostic imaging of other specified body structures: Secondary | ICD-10-CM

## 2021-05-11 DIAGNOSIS — I10 Essential (primary) hypertension: Secondary | ICD-10-CM

## 2021-05-11 LAB — CBC AND DIFF
ABSOLUTE BASO COUNT: 0 K/UL (ref 0–0.20)
ABSOLUTE EOS COUNT: 0.1 K/UL (ref 0–0.45)
ABSOLUTE LYMPH COUNT: 1.2 K/UL (ref 1.0–4.8)
ABSOLUTE MONO COUNT: 0.2 K/UL (ref 0–0.80)
ABSOLUTE NEUTROPHIL: 2.3 K/UL (ref 1.8–7.0)
BASOPHILS %: 1 % (ref 0–2)
EOSINOPHILS %: 2 % (ref >60)
HEMATOCRIT: 41 % (ref 36–45)
HEMOGLOBIN: 13 g/dL (ref 12.0–15.0)
LYMPHOCYTES %: 32 % (ref 24–44)
MCH: 28 pg (ref 26–34)
MCHC: 33 g/dL (ref 32.0–36.0)
MCV: 86 FL (ref 80–100)
MONOCYTES %: 5 % (ref 4–12)
MPV: 8 FL (ref 7–11)
NEUTROPHILS %: 60 % (ref 41–77)
RBC COUNT: 4.8 M/UL — ABNORMAL HIGH (ref 4.0–5.0)
RDW: 14 % (ref 11–15)
WBC COUNT: 3.9 K/UL — ABNORMAL LOW (ref 4.5–11.0)

## 2021-05-11 LAB — COMPREHENSIVE METABOLIC PANEL
ALK PHOSPHATASE: 88 U/L (ref 25–110)
POTASSIUM: 3.9 MMOL/L (ref 3.5–5.1)
SODIUM: 139 MMOL/L (ref 137–147)

## 2021-05-11 LAB — LDH-LACTATE DEHYDROGENASE: LDH: 165 U/L (ref 100–210)
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
## 2021-05-31 ENCOUNTER — Encounter: Admit: 2021-05-31 | Discharge: 2021-05-31 | Payer: MEDICARE

## 2021-05-31 ENCOUNTER — Ambulatory Visit: Admit: 2021-05-31 | Discharge: 2021-06-01 | Payer: MEDICARE

## 2021-05-31 DIAGNOSIS — M5416 Radiculopathy, lumbar region: Secondary | ICD-10-CM

## 2021-05-31 MED ORDER — OXYCODONE-ACETAMINOPHEN 7.5-325 MG PO TAB
1 | ORAL_TABLET | Freq: Two times a day (BID) | ORAL | 0 refills | 2.00000 days | Status: AC | PRN
Start: 2021-05-31 — End: ?

## 2021-05-31 NOTE — Progress Notes
SPINE CENTER CLINIC NOTE       Obtained patient's verbal consent to treat them and their agreement to [facility] financial policy and NPP via this telehealth visit during the Coronavirus Public Health Emergency    SUBJECTIVE: Kara Andersen participated in a Zoom video telehealth conference for ongoing care regarding back and lower extremity pain.  Pain is bilateral and intermittently sharp and dull.  Walking and standing exacerbate the pain.  Pain is improved with rest.  Lumbar transforaminal epidural steroid injections reduce pain by about 50% for about 3 months at a time she wishes to have them repeated.  For pain relief she has Percocet 7.5-325 mg available once to twice per day.         Review of Systems 14 system review is negative except musculoskeletal pain and spasm in the back     Current Outpatient Medications:   ?  cyclobenzaprine (FLEXERIL) 10 mg tablet, Take one tablet by mouth at bedtime daily., Disp: 90 tablet, Rfl: 3  ?  docusate (COLACE) 100 mg capsule, Take one capsule by mouth twice daily as needed for Constipation., Disp: 180 capsule, Rfl: 0  ?  escitalopram oxalate (LEXAPRO) 10 mg tablet, Take one tablet by mouth daily., Disp: , Rfl:   ?  gabapentin (NEURONTIN) 600 mg tablet, Take one tablet by mouth three times daily., Disp: 90 tablet, Rfl: 11  ?  naloxone (NARCAN) 4 mg/actuation nasal spray, Insert 1 spray into 1 nostril as needed for signs of opioid overdose then call 911. May repeat dose every 2-3 minutes (alternate nostrils) until medical team arrives.  Indications: opioid overdose, Disp: 2 each, Rfl: 1  ?  oxyCODONE-acetaminophen (PERCOCET) 7.5-325 mg tablet, Take one tablet by mouth every 12 hours as needed for Pain. Do not start before May 18, 2021., Disp: 45 tablet, Rfl: 0  ?  oxyCODONE-acetaminophen (PERCOCET) 7.5-325 mg tablet, Take one tablet by mouth every 12 hours as needed for Pain. Do not start before February 16, 2021., Disp: 45 tablet, Rfl: 0  ?  oxyCODONE-acetaminophen (PERCOCET) 7.5-325 mg tablet, Take one tablet by mouth every 12 hours as needed for Pain. Do not start before September 18, 2020., Disp: 45 tablet, Rfl: 0  ?  oxyCODONE-acetaminophen (PERCOCET) 7.5-325 mg tablet, Take one tablet by mouth every 12 hours as needed for Pain., Disp: 45 tablet, Rfl: 0  ?  Potassium 99 mg tab, Take three tablets by mouth daily., Disp: , Rfl:   Allergies   Allergen Reactions   ? Other [Unclassified Drug] HIVES     Pt stated had a reaction to a migraine medication but doesn't remember the name.     Physical Exam  Vitals:    05/31/21 1004   PainSc: Seven        Pain Score: Seven  There is no height or weight on file to calculate BMI.  General: Alert and oriented, very pleasant female.   HEENT showed extraocular muscles were intact and no other abnormalities.  Unlabored breathing.  Regular rate and rhythm on CV exam.   5/5 strength in bilateral upper and lower extremities.    Sensation is intact to light touch and equal in the upper and lower extremities.         IMPRESSION:  1. Lumbar radiculopathy          PLAN: We will schedule a bilateral L4-5 transforaminal epidural steroid injection.  We will refill Percocet as described above.  We will continue with home exercise plan as prescribed by  physical therapy for core strengthening.    Visit Start Time 10:30 Visit End Time 11:03.

## 2021-06-18 ENCOUNTER — Encounter: Admit: 2021-06-18 | Discharge: 2021-06-18 | Payer: MEDICARE

## 2021-06-18 MED ORDER — OXYCODONE-ACETAMINOPHEN 7.5-325 MG PO TAB
1 | ORAL_TABLET | Freq: Two times a day (BID) | ORAL | 0 refills | 2.00000 days | Status: AC | PRN
Start: 2021-06-18 — End: ?

## 2021-06-18 NOTE — Telephone Encounter
Scripts sent in for wrong dates. Routing new scripts to provider to sign off on.

## 2021-08-15 ENCOUNTER — Encounter: Admit: 2021-08-15 | Discharge: 2021-08-15 | Payer: MEDICARE

## 2021-08-27 ENCOUNTER — Encounter: Admit: 2021-08-27 | Discharge: 2021-08-27 | Payer: MEDICARE

## 2021-08-27 MED ORDER — GABAPENTIN 600 MG PO TAB
ORAL_TABLET | 2 refills | Status: AC
Start: 2021-08-27 — End: ?

## 2021-08-27 NOTE — Telephone Encounter
Patient requesting refill of gabapentin  Last Office Visit 05/31/21  Next Office Visit 08/30/21  Required labs NA  [facility] last filled NA

## 2021-08-30 ENCOUNTER — Encounter: Admit: 2021-08-30 | Discharge: 2021-08-30 | Payer: MEDICARE

## 2021-08-30 ENCOUNTER — Ambulatory Visit: Admit: 2021-08-30 | Discharge: 2021-08-31 | Payer: MEDICARE

## 2021-08-30 DIAGNOSIS — M5116 Intervertebral disc disorders with radiculopathy, lumbar region: Secondary | ICD-10-CM

## 2021-08-30 DIAGNOSIS — M5416 Radiculopathy, lumbar region: Secondary | ICD-10-CM

## 2021-08-30 MED ORDER — OXYCODONE-ACETAMINOPHEN 7.5-325 MG PO TAB
1 | ORAL_TABLET | Freq: Two times a day (BID) | ORAL | 0 refills | 2.00000 days | Status: AC | PRN
Start: 2021-08-30 — End: ?

## 2021-08-30 MED ORDER — TIZANIDINE 2 MG PO TAB
2 mg | ORAL_TABLET | Freq: Three times a day (TID) | ORAL | 3 refills | Status: AC
Start: 2021-08-30 — End: ?

## 2021-08-30 NOTE — Progress Notes
SPINE CENTER CLINIC NOTE        Obtained patient's verbal consent to treat them and their agreement to [facility] financial policy and NPP via this telehealth visit during the Coronavirus Public Health Emergency    SUBJECTIVE: Kara Andersen presents for care regarding bilateral lumbosacral pain.  Pain is intermittently sharp and dull, worse with walking standing and bending.  For pain relief she is taking oxycodone 7.5-325 mg once to twice per day.  She stopped taking Flexeril due to sedation.       Review of Systems    Current Outpatient Medications:   ?  cyclobenzaprine (FLEXERIL) 10 mg tablet, Take one tablet by mouth at bedtime daily., Disp: 90 tablet, Rfl: 3  ?  docusate (COLACE) 100 mg capsule, Take one capsule by mouth twice daily as needed for Constipation., Disp: 180 capsule, Rfl: 0  ?  escitalopram oxalate (LEXAPRO) 10 mg tablet, Take one tablet by mouth daily., Disp: , Rfl:   ?  gabapentin (NEURONTIN) 600 mg tablet, TAKE 1 TABLET BY MOUTH THREE TIMES DAILY, Disp: 90 tablet, Rfl: 2  ?  naloxone (NARCAN) 4 mg/actuation nasal spray, Insert 1 spray into 1 nostril as needed for signs of opioid overdose then call 911. May repeat dose every 2-3 minutes (alternate nostrils) until medical team arrives.  Indications: opioid overdose, Disp: 2 each, Rfl: 1  ?  [START ON 09/16/2021] oxyCODONE-acetaminophen (PERCOCET) 7.5-325 mg tablet, Take one tablet by mouth every 12 hours as needed for Pain. Do not start before September 16, 2021., Disp: 45 tablet, Rfl: 0  ?  [START ON 10/16/2021] oxyCODONE-acetaminophen (PERCOCET) 7.5-325 mg tablet, Take one tablet by mouth every 12 hours as needed for Pain. Do not start before October 16, 2021., Disp: 45 tablet, Rfl: 0  ?  [START ON 11/15/2021] oxyCODONE-acetaminophen (PERCOCET) 7.5-325 mg tablet, Take one tablet by mouth every 12 hours as needed for Pain. Do not start before November 15, 2021., Disp: 45 tablet, Rfl: 0  ?  oxyCODONE-acetaminophen (PERCOCET) 7.5-325 mg tablet, Take one tablet by mouth every 12 hours as needed for Pain., Disp: 45 tablet, Rfl: 0  ?  oxyCODONE-acetaminophen (PERCOCET) 7.5-325 mg tablet, Take one tablet by mouth every 12 hours as needed for Pain. Do not start before August 17, 2021., Disp: 45 tablet, Rfl: 0  ?  Potassium 99 mg tab, Take three tablets by mouth daily., Disp: , Rfl:   ?  tiZANidine (ZANAFLEX) 2 mg tablet, Take one tablet by mouth three times daily., Disp: 30 tablet, Rfl: 3  Allergies   Allergen Reactions   ? Other [Unclassified Drug] HIVES     Pt stated had a reaction to a migraine medication but doesn't remember the name.     Physical Exam  Vitals:    08/30/21 1310   PainSc: Five   Weight: 64 kg (141 lb)   Height: 165.1 cm (5' 5)        Pain Score: Five  Body mass index is 23.46 kg/m?Marland Kitchen  General: Alert and oriented, very pleasant female.   HEENT showed extraocular muscles were intact and no other abnormalities.  Unlabored breathing.  Regular rate and rhythm on CV exam.          IMPRESSION:  1. Lumbar disc disease with radiculopathy    2. Lumbar radicular pain          PLAN: We will prescribe oxycodone as described above and add tizanidine 2 mg 1 tablet p.o. daily as needed #30 in place of flex  Visit Start Time 13:22 Visit End Time 13:50.

## 2021-11-01 ENCOUNTER — Encounter: Admit: 2021-11-01 | Discharge: 2021-11-01 | Payer: MEDICARE

## 2021-11-01 MED ORDER — GABAPENTIN 600 MG PO TAB
600 mg | ORAL_TABLET | Freq: Three times a day (TID) | ORAL | 5 refills
Start: 2021-11-01 — End: ?

## 2021-11-12 ENCOUNTER — Encounter: Admit: 2021-11-12 | Discharge: 2021-11-12 | Payer: MEDICARE

## 2021-11-12 DIAGNOSIS — M48061 Spinal stenosis, lumbar region without neurogenic claudication: Secondary | ICD-10-CM

## 2021-11-12 DIAGNOSIS — M792 Neuralgia and neuritis, unspecified: Secondary | ICD-10-CM

## 2021-11-12 DIAGNOSIS — R9389 Abnormal findings on diagnostic imaging of other specified body structures: Secondary | ICD-10-CM

## 2021-11-12 DIAGNOSIS — C8590 Non-Hodgkin lymphoma, unspecified, unspecified site: Secondary | ICD-10-CM

## 2021-11-12 DIAGNOSIS — C8332 Diffuse large B-cell lymphoma, intrathoracic lymph nodes: Secondary | ICD-10-CM

## 2021-11-12 DIAGNOSIS — I10 Essential (primary) hypertension: Secondary | ICD-10-CM

## 2021-11-12 DIAGNOSIS — C801 Malignant (primary) neoplasm, unspecified: Secondary | ICD-10-CM

## 2021-11-12 DIAGNOSIS — R079 Chest pain, unspecified: Secondary | ICD-10-CM

## 2021-11-12 LAB — LDH-LACTATE DEHYDROGENASE: LDH: 164 U/L (ref 100–210)

## 2021-11-12 LAB — CBC AND DIFF
ABSOLUTE BASO COUNT: 0 K/UL (ref 0–0.20)
ABSOLUTE EOS COUNT: 0.1 K/UL (ref 0–0.45)
ABSOLUTE LYMPH COUNT: 1.3 K/UL (ref 1.0–4.8)
ABSOLUTE MONO COUNT: 0.3 K/UL (ref 0–0.80)
ABSOLUTE NEUTROPHIL: 3.2 K/UL (ref 1.8–7.0)
BASOPHILS %: 1 % (ref 0–2)
EOSINOPHILS %: 1 % (ref 0–5)
HEMATOCRIT: 41 % (ref 36–45)
HEMOGLOBIN: 13 g/dL (ref 12.0–15.0)
LYMPHOCYTES %: 26 % (ref 24–44)
MCH: 28 pg (ref 26–34)
MCHC: 32 g/dL (ref 32.0–36.0)
MCV: 87 FL (ref 80–100)
MONOCYTES %: 7 % (ref >60)
MPV: 7.7 FL (ref 7–11)
PLATELET COUNT: 211 K/UL (ref 150–400)
RDW: 14 % (ref 11–15)
WBC COUNT: 4.9 K/UL — ABNORMAL HIGH (ref 4.5–11.0)

## 2021-11-12 LAB — COMPREHENSIVE METABOLIC PANEL
ALT: 19 U/L (ref 7–56)
BLD UREA NITROGEN: 7 mg/dL (ref 7–25)
CHLORIDE: 100 MMOL/L (ref 98–110)
POTASSIUM: 3.7 MMOL/L (ref 3.5–5.1)
SODIUM: 139 MMOL/L (ref 137–147)

## 2021-11-12 NOTE — Progress Notes
Name: Kara Andersen          MRN: [id number]      DOB: 09-06-1970      AGE: 51 y.o.   DATE OF SERVICE: 11/12/2021    Subjective:             Reason for Visit:  Follow Up      Kara Andersen is a 51 y.o. female.      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  She completed 6 cycles of R-CHOP with CR.      Subjective  Walking more  Gained weight-5 lbs in last year  She underwent anterior/posterior lumbar instrumented fusion in April 2021.  Her pain is better.    Dr. Samara Deist is managing her pain medication.     She is on disability.    She has several dogs  Her activity is limited by her back pain.    Denies fevers or infectious symptoms.    No GI or GU complaints.    No new adenopathy.    No night sweats  Tired  Breast biopsy June 2022--not cancer, monitoring    HPI  1.  Presented May 2020 with bulky mediastinal mass.  2.  06/22/2018 core needle biopsy of mediastinal lesion revealed CD10 negative CD30 negative diffuse large B-cell lymphoma.  Both FISH and IHC for MYC were negative.    3.  06/23/2018 PET/CT revealed bulky hypermetabolic mediastinal mass along with lesions in the pleura and liver.  Staging bone marrow was negative.  4.  R-CHOP x 6 with CR.    She is alone.           Objective:         ? cyclobenzaprine (FLEXERIL) 10 mg tablet Take one tablet by mouth at bedtime daily.   ? docusate (COLACE) 100 mg capsule Take one capsule by mouth twice daily as needed for Constipation.   ? escitalopram oxalate (LEXAPRO) 10 mg tablet Take one tablet by mouth daily.   ? gabapentin (NEURONTIN) 600 mg tablet TAKE 1 TABLET BY MOUTH THREE TIMES DAILY   ? naloxone (NARCAN) 4 mg/actuation nasal spray Insert 1 spray into 1 nostril as needed for signs of opioid overdose then call 911. May repeat dose every 2-3 minutes (alternate nostrils) until medical team arrives.  Indications: opioid overdose   ? oxyCODONE-acetaminophen (PERCOCET) 7.5-325 mg tablet Take one tablet by mouth every 12 hours as needed for Pain. Do not start before September 16, 2021.   ? oxyCODONE-acetaminophen (PERCOCET) 7.5-325 mg tablet Take one tablet by mouth every 12 hours as needed for Pain. Do not start before October 16, 2021.   ? [START ON 11/15/2021] oxyCODONE-acetaminophen (PERCOCET) 7.5-325 mg tablet Take one tablet by mouth every 12 hours as needed for Pain. Do not start before November 15, 2021.   ? oxyCODONE-acetaminophen (PERCOCET) 7.5-325 mg tablet Take one tablet by mouth every 12 hours as needed for Pain.   ? oxyCODONE-acetaminophen (PERCOCET) 7.5-325 mg tablet Take one tablet by mouth every 12 hours as needed for Pain. Do not start before August 17, 2021.   ?  Potassium 99 mg tab Take three tablets by mouth daily.   ? tiZANidine (ZANAFLEX) 2 mg tablet Take one tablet by mouth three times daily.     Vitals:    11/12/21 1544   BP: 135/78   BP Source: Arm, Left Upper   Pulse: 75   Temp: 36.7 ?C (98 ?F)   Resp: 16   SpO2: 100%   TempSrc: Oral   PainSc: Zero   Weight: 64.9 kg (143 lb)     Body mass index is 23.8 kg/m?Marland Kitchen     Pain Score: Zero         Pain Addressed:  N/A  [place] Cooperative Oncology Group performance status is 1, Restricted in physically strenuous activity but ambulatory and able to carry out work of a light or sedentary nature, e.g., light house work, office work.     Physical Exam  Vitals reviewed.   Constitutional:       Appearance: Normal appearance. She is well-developed.   HENT:      Head: Normocephalic.   Cardiovascular:      Rate and Rhythm: Normal rate and regular rhythm.   Pulmonary:      Effort: Pulmonary effort is normal.      Breath sounds: Normal breath sounds.   Abdominal:      Palpations: Abdomen is soft.   Musculoskeletal:         General: Normal range of motion.      Cervical back: Normal range of motion.   Lymphadenopathy:      Cervical: No cervical adenopathy.      Upper Body:      Right upper body: No supraclavicular or axillary adenopathy.      Left upper body: No supraclavicular or axillary adenopathy.   Skin:     General: Skin is warm and dry.   Neurological:      General: No focal deficit present.      Mental Status: She is alert and oriented to person, place, and time.   Psychiatric:         Mood and Affect: Mood normal.            CBC w/Diff    Lab Results   Component Value Date/Time    WBC 3.9 (L) 05/11/2021 01:47 PM    RBC 4.83 05/11/2021 01:47 PM    HGB 13.9 05/11/2021 01:47 PM    HCT 41.6 05/11/2021 01:47 PM    MCV 86.2 05/11/2021 01:47 PM    MCH 28.7 05/11/2021 01:47 PM    MCHC 33.3 05/11/2021 01:47 PM    RDW 14.4 05/11/2021 01:47 PM    PLTCT 217 05/11/2021 01:47 PM    MPV 8.0 05/11/2021 01:47 PM    Lab Results   Component Value Date/Time    NEUT 60 05/11/2021 01:47 PM    ANC 2.30 05/11/2021 01:47 PM    LYMA 32 05/11/2021 01:47 PM    ALC 1.20 05/11/2021 01:47 PM    MONA 5 05/11/2021 01:47 PM    AMC 0.20 05/11/2021 01:47 PM    EOSA 2 05/11/2021 01:47 PM    AEC 0.10 05/11/2021 01:47 PM    BASA 1 05/11/2021 01:47 PM    ABC 0.00 05/11/2021 01:47 PM             Assessment and Plan:      ?   1. 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  Her end of  treatment PET scan on 11/11/2018 revealed complete remission.  She has no evidence of disease on exam today.  She will follow up with Dr. Mikey Bussing as scheduled.She will call in the interim with any problems or concerns.  2. She has a primary doctor, Dr. Maisie Fus.  She does not routinely get the flu shot or any other vaccines.  She is not interested in getting the Covid vaccines.  We discussed healthy lifestyle behaviors including exercise.  I recommended colonoscopy and routine mammograms.  3. 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  She underwent surgery in April 2021 with improvement with Dr. Jean Rosenthal.  She sees Dr. Samara Deist routinely.  She gets routine steroid injections.  She uses oxycodone and Flexeril as needed.

## 2021-11-22 ENCOUNTER — Encounter: Admit: 2021-11-22 | Discharge: 2021-11-22 | Payer: MEDICARE

## 2021-11-22 ENCOUNTER — Ambulatory Visit: Admit: 2021-11-22 | Discharge: 2021-11-22 | Payer: MEDICARE

## 2021-11-22 DIAGNOSIS — C8590 Non-Hodgkin lymphoma, unspecified, unspecified site: Secondary | ICD-10-CM

## 2021-11-22 DIAGNOSIS — M5416 Radiculopathy, lumbar region: Secondary | ICD-10-CM

## 2021-11-22 DIAGNOSIS — M48061 Spinal stenosis, lumbar region without neurogenic claudication: Secondary | ICD-10-CM

## 2021-11-22 DIAGNOSIS — C801 Malignant (primary) neoplasm, unspecified: Secondary | ICD-10-CM

## 2021-11-22 DIAGNOSIS — M792 Neuralgia and neuritis, unspecified: Secondary | ICD-10-CM

## 2021-11-22 DIAGNOSIS — R079 Chest pain, unspecified: Secondary | ICD-10-CM

## 2021-11-22 DIAGNOSIS — I10 Essential (primary) hypertension: Secondary | ICD-10-CM

## 2021-11-22 MED ORDER — OXYCODONE-ACETAMINOPHEN 7.5-325 MG PO TAB
1 | ORAL_TABLET | Freq: Two times a day (BID) | ORAL | 0 refills | 2.00000 days | Status: AC | PRN
Start: 2021-11-22 — End: ?

## 2021-11-22 NOTE — Progress Notes
SPINE CENTER CLINIC NOTE       SUBJECTIVE: Kara Andersen presents in follow-up for ongoing care regarding back and lower extremity pain.  Pain is in the bilateral lumbar region described as intermittently sharp and dull.  Walking and standing exacerbates pain.  Pain is proved with rest.  Lumbar epidural steroid injections have reduced pain by more than 50% for about 3 months at a time she wishes to have 1 scheduled.  For pain relief she is taking oxycodone 7.5-325 mg twice per day, tizanidine 2 mg twice per day as needed and gabapentin 300 mg twice per day         Review of Systems    Current Outpatient Medications:   ?  cyclobenzaprine (FLEXERIL) 10 mg tablet, Take one tablet by mouth at bedtime daily., Disp: 90 tablet, Rfl: 3  ?  docusate (COLACE) 100 mg capsule, Take one capsule by mouth twice daily as needed for Constipation., Disp: 180 capsule, Rfl: 0  ?  escitalopram oxalate (LEXAPRO) 10 mg tablet, Take one tablet by mouth daily., Disp: , Rfl:   ?  gabapentin (NEURONTIN) 600 mg tablet, TAKE 1 TABLET BY MOUTH THREE TIMES DAILY, Disp: 90 tablet, Rfl: 2  ?  naloxone (NARCAN) 4 mg/actuation nasal spray, Insert 1 spray into 1 nostril as needed for signs of opioid overdose then call 911. May repeat dose every 2-3 minutes (alternate nostrils) until medical team arrives.  Indications: opioid overdose, Disp: 2 each, Rfl: 1  ?  [START ON 01/14/2022] oxyCODONE-acetaminophen (PERCOCET) 7.5-325 mg tablet, Take one tablet by mouth every 12 hours as needed for Pain. Do not start before January 14, 2022., Disp: 60 tablet, Rfl: 0  ?  [START ON 12/15/2021] oxyCODONE-acetaminophen (PERCOCET) 7.5-325 mg tablet, Take one tablet by mouth every 12 hours as needed for Pain. Do not start before December 15, 2021., Disp: 45 tablet, Rfl: 0  ?  [START ON 02/13/2022] oxyCODONE-acetaminophen (PERCOCET) 7.5-325 mg tablet, Take one tablet by mouth every 12 hours as needed for Pain. Do not start before February 13, 2022., Disp: 60 tablet, Rfl: 0  ?  oxyCODONE-acetaminophen (PERCOCET) 7.5-325 mg tablet, Take one tablet by mouth every 12 hours as needed for Pain. Do not start before September 16, 2021., Disp: 45 tablet, Rfl: 0  ?  oxyCODONE-acetaminophen (PERCOCET) 7.5-325 mg tablet, Take one tablet by mouth every 12 hours as needed for Pain. Do not start before October 16, 2021., Disp: 45 tablet, Rfl: 0  ?  oxyCODONE-acetaminophen (PERCOCET) 7.5-325 mg tablet, Take one tablet by mouth every 12 hours as needed for Pain., Disp: 45 tablet, Rfl: 0  ?  oxyCODONE-acetaminophen (PERCOCET) 7.5-325 mg tablet, Take one tablet by mouth every 12 hours as needed for Pain. Do not start before August 17, 2021., Disp: 45 tablet, Rfl: 0  ?  Potassium 99 mg tab, Take three tablets by mouth daily., Disp: , Rfl:   ?  tiZANidine (ZANAFLEX) 2 mg tablet, Take one tablet by mouth three times daily., Disp: 30 tablet, Rfl: 3  Allergies   Allergen Reactions   ? Other [Unclassified Drug] HIVES     Pt stated had a reaction to a migraine medication but doesn't remember the name.     Physical Exam  Vitals:    11/22/21 1104   BP: (!) 144/98   Pulse: 78   SpO2: 99%   PainSc: Six   Weight: 64.9 kg (143 lb)   Height: 165.1 cm (5' 5)     Oswestry Total Score:: 30  Pain Score: Six  Body mass index is 23.8 kg/m?Marland Kitchen  General: Alert and oriented, very pleasant female.   HEENT showed extraocular muscles were intact and no other abnormalities.  Unlabored breathing.  Regular rate and rhythm on CV exam.   5/5 strength in bilateral upper and lower extremities.    Sensation is intact to light touch and equal in the upper and lower extremities.  There is bilateral lumbar tenderness to palpation         IMPRESSION:  1. Lumbar radiculopathy          PLAN: We will increase gabapentin to 600 mg 3 times per day.  We will refill tizanidine and Percocet as described above.  Follow-up in 3 months for clinic and will also schedule lumbar epidural steroid injection under fluoroscopy at L5-S1

## 2021-12-06 ENCOUNTER — Encounter: Admit: 2021-12-06 | Discharge: 2021-12-06 | Payer: MEDICARE

## 2021-12-06 MED ORDER — TIZANIDINE 2 MG PO TAB
2 mg | ORAL_TABLET | Freq: Three times a day (TID) | ORAL | 0 refills
Start: 2021-12-06 — End: ?

## 2022-01-09 ENCOUNTER — Encounter: Admit: 2022-01-09 | Discharge: 2022-01-09 | Payer: MEDICARE

## 2022-01-09 MED ORDER — GABAPENTIN 600 MG PO TAB
ORAL_TABLET | 3 refills | Status: AC
Start: 2022-01-09 — End: ?

## 2022-01-09 NOTE — Telephone Encounter
Patient requesting refill of gabapentin  Last Office Visit 11/22/21  Next Office Visit 02/21/22  Required labs NA  [facility] last filled NA

## 2022-02-15 ENCOUNTER — Encounter: Admit: 2022-02-15 | Discharge: 2022-02-15 | Payer: MEDICARE

## 2022-02-15 NOTE — Telephone Encounter
-    Voice Message from the pt stating that she needs a refill of Percocet sent to [facility] in [place] already has a refill at that same pharmacy that was available on 02/13/2022.     -  Verified with [facility] that the rx is in their system & that they will fill it. Pt notified & she agreed with the plan.

## 2022-03-12 ENCOUNTER — Encounter: Admit: 2022-03-12 | Discharge: 2022-03-12 | Payer: MEDICARE

## 2022-03-12 NOTE — Telephone Encounter
-    Voice Message from the pt statin that she just received a phone call & wasn't able to catch it.    -  Instructed the pt that I am unsure why she was called because there is no way to track down who called her other than there is notation in her record that someone from the procedure dept tried to call her & they documented a telephone message says wireless customer not available. In addition, this RN confirmed that she has a procedure appt at 11:00 tomorrow.    -  Pt reported understanding & thanked this RN for calling her back.

## 2022-03-13 ENCOUNTER — Encounter: Admit: 2022-03-13 | Discharge: 2022-03-13 | Payer: MEDICARE

## 2022-03-13 ENCOUNTER — Ambulatory Visit: Admit: 2022-03-13 | Discharge: 2022-03-13 | Payer: MEDICARE

## 2022-03-13 DIAGNOSIS — R079 Chest pain, unspecified: Secondary | ICD-10-CM

## 2022-03-13 DIAGNOSIS — C8590 Non-Hodgkin lymphoma, unspecified, unspecified site: Secondary | ICD-10-CM

## 2022-03-13 DIAGNOSIS — M792 Neuralgia and neuritis, unspecified: Secondary | ICD-10-CM

## 2022-03-13 DIAGNOSIS — M5416 Radiculopathy, lumbar region: Secondary | ICD-10-CM

## 2022-03-13 DIAGNOSIS — C801 Malignant (primary) neoplasm, unspecified: Secondary | ICD-10-CM

## 2022-03-13 DIAGNOSIS — M48061 Spinal stenosis, lumbar region without neurogenic claudication: Secondary | ICD-10-CM

## 2022-03-13 DIAGNOSIS — I10 Essential (primary) hypertension: Secondary | ICD-10-CM

## 2022-03-13 MED ORDER — OXYCODONE-ACETAMINOPHEN 7.5-325 MG PO TAB
1 | ORAL_TABLET | Freq: Two times a day (BID) | ORAL | 0 refills | 2.00000 days | Status: AC | PRN
Start: 2022-03-13 — End: ?

## 2022-03-13 MED ORDER — IOHEXOL 240 MG IODINE/ML IV SOLN
2.5 mL | Freq: Once | EPIDURAL | 0 refills | Status: CP
Start: 2022-03-13 — End: ?
  Administered 2022-03-13: 18:00:00 2.5 mL via EPIDURAL

## 2022-03-13 MED ORDER — LIDOCAINE (PF) 10 MG/ML (1 %) IJ SOLN
2 mL | Freq: Once | INTRAMUSCULAR | 0 refills | Status: AC
Start: 2022-03-13 — End: ?

## 2022-03-13 MED ORDER — TRIAMCINOLONE ACETONIDE 40 MG/ML IJ SUSP
80 mg | Freq: Once | EPIDURAL | 0 refills | Status: CP
Start: 2022-03-13 — End: ?
  Administered 2022-03-13 (×2): 80 mg via EPIDURAL

## 2022-03-13 MED ORDER — SODIUM CHLORIDE 0.9 % IJ SOLN
3 mL | Freq: Once | INTRAMUSCULAR | 0 refills | Status: AC
Start: 2022-03-13 — End: ?

## 2022-04-05 ENCOUNTER — Encounter: Admit: 2022-04-05 | Discharge: 2022-04-05 | Payer: MEDICARE

## 2022-04-05 MED ORDER — TIZANIDINE 2 MG PO TAB
2 mg | ORAL_TABLET | Freq: Three times a day (TID) | ORAL | 3 refills | Status: AC | PRN
Start: 2022-04-05 — End: ?

## 2022-04-05 NOTE — Telephone Encounter
Patient requesting refill of tizanidine  Last Office Visit 11/22/21  Next Office Visit procedure on 06/12/22  Required labs NA  Ktracks last filled NA

## 2022-04-11 ENCOUNTER — Encounter: Admit: 2022-04-11 | Discharge: 2022-04-11 | Payer: MEDICARE

## 2022-04-11 MED ORDER — OXYCODONE-ACETAMINOPHEN 7.5-325 MG PO TAB
1 | ORAL_TABLET | Freq: Two times a day (BID) | ORAL | 0 refills | PRN
Start: 2022-04-11 — End: ?

## 2022-04-11 NOTE — Telephone Encounter
Patient requesting refill of oxycodone  Last Office Visit 11/23/22  Next Office Visit 05/02/22  Required labs NA  Ktracks last filled 03/15/22

## 2022-04-25 ENCOUNTER — Encounter: Admit: 2022-04-25 | Discharge: 2022-04-25 | Payer: MEDICARE

## 2022-04-25 MED ORDER — GABAPENTIN 600 MG PO TAB
ORAL_TABLET | 0 refills | Status: AC
Start: 2022-04-25 — End: ?

## 2022-04-25 NOTE — Telephone Encounter
Patient requesting refill of gabapentin  Last Office Visit 11/22/21  Next Office Visit 05/02/22  Required labs NA  Ktracks last filled NA

## 2022-05-02 ENCOUNTER — Encounter: Admit: 2022-05-02 | Discharge: 2022-05-02 | Payer: MEDICARE

## 2022-05-02 ENCOUNTER — Ambulatory Visit: Admit: 2022-05-02 | Discharge: 2022-05-02 | Payer: MEDICARE

## 2022-05-02 DIAGNOSIS — M5416 Radiculopathy, lumbar region: Secondary | ICD-10-CM

## 2022-05-02 DIAGNOSIS — M48061 Spinal stenosis, lumbar region without neurogenic claudication: Secondary | ICD-10-CM

## 2022-05-02 DIAGNOSIS — C801 Malignant (primary) neoplasm, unspecified: Secondary | ICD-10-CM

## 2022-05-02 DIAGNOSIS — C8590 Non-Hodgkin lymphoma, unspecified, unspecified site: Secondary | ICD-10-CM

## 2022-05-02 DIAGNOSIS — M792 Neuralgia and neuritis, unspecified: Secondary | ICD-10-CM

## 2022-05-02 DIAGNOSIS — R079 Chest pain, unspecified: Secondary | ICD-10-CM

## 2022-05-02 DIAGNOSIS — I10 Essential (primary) hypertension: Secondary | ICD-10-CM

## 2022-05-02 MED ORDER — OXYCODONE-ACETAMINOPHEN 7.5-325 MG PO TAB
1 | ORAL_TABLET | Freq: Two times a day (BID) | ORAL | 0 refills | 2.00000 days | Status: AC | PRN
Start: 2022-05-02 — End: ?

## 2022-05-02 NOTE — Progress Notes
[facility] CLINIC NOTE       SUBJECTIVE: Kara Andersen presents in follow-up for ongoing care regarding back and lower extremity pain.  Pain is bilateral and intermittently sharp and dull.  Walking and standing exacerbates pain.  Pain is improved with rest.  For pain relief she is taking Percocet 7.5-325 mg 2 times per day.  Lumbar epidural steroid injections have reduced pain by about 50% for 3 months         Review of Systems    Current Outpatient Medications:     cyclobenzaprine (FLEXERIL) 10 mg tablet, Take one tablet by mouth at bedtime daily., Disp: 90 tablet, Rfl: 3    docusate (COLACE) 100 mg capsule, Take one capsule by mouth twice daily as needed for Constipation., Disp: 180 capsule, Rfl: 0    escitalopram oxalate (LEXAPRO) 10 mg tablet, Take one tablet by mouth daily., Disp: , Rfl:     gabapentin (NEURONTIN) 600 mg tablet, TAKE 1 TABLET BY MOUTH THREE TIMES DAILY, Disp: 90 tablet, Rfl: 0    naloxone (NARCAN) 4 mg/actuation nasal spray, Insert 1 spray into 1 nostril as needed for signs of opioid overdose then call 911. May repeat dose every 2-3 minutes (alternate nostrils) until medical team arrives.  Indications: opioid overdose, Disp: 2 each, Rfl: 1    [START ON 05/12/2022] oxyCODONE-acetaminophen (PERCOCET) 7.5-325 mg tablet, Take one tablet by mouth every 12 hours as needed for Pain. Do not start before May 12, 2022., Disp: 60 tablet, Rfl: 0    [START ON 06/11/2022] oxyCODONE-acetaminophen (PERCOCET) 7.5-325 mg tablet, Take one tablet by mouth every 12 hours as needed for Pain. Do not start before Jun 11, 2022., Disp: 60 tablet, Rfl: 0    [START ON 07/11/2022] oxyCODONE-acetaminophen (PERCOCET) 7.5-325 mg tablet, Take one tablet by mouth every 12 hours as needed for Pain. Do not start before July 11, 2022., Disp: 60 tablet, Rfl: 0    oxyCODONE-acetaminophen (PERCOCET) 7.5-325 mg tablet, Take one tablet by mouth every 12 hours as needed for Pain., Disp: 60 tablet, Rfl: 0    oxyCODONE-acetaminophen (PERCOCET) 7.5-325 mg tablet, Take one tablet by mouth every 12 hours as needed for Pain. Do not start before March 15, 2022., Disp: 60 tablet, Rfl: 0    oxyCODONE-acetaminophen (PERCOCET) 7.5-325 mg tablet, Take one tablet by mouth every 12 hours as needed for Pain. Do not start before December 15, 2021., Disp: 45 tablet, Rfl: 0    oxyCODONE-acetaminophen (PERCOCET) 7.5-325 mg tablet, Take one tablet by mouth every 12 hours as needed for Pain. Do not start before February 13, 2022., Disp: 60 tablet, Rfl: 0    oxyCODONE-acetaminophen (PERCOCET) 7.5-325 mg tablet, Take one tablet by mouth every 12 hours as needed for Pain. Do not start before September 16, 2021., Disp: 45 tablet, Rfl: 0    oxyCODONE-acetaminophen (PERCOCET) 7.5-325 mg tablet, Take one tablet by mouth every 12 hours as needed for Pain. Do not start before October 16, 2021., Disp: 45 tablet, Rfl: 0    oxyCODONE-acetaminophen (PERCOCET) 7.5-325 mg tablet, Take one tablet by mouth every 12 hours as needed for Pain. Do not start before August 17, 2021., Disp: 45 tablet, Rfl: 0    Potassium 99 mg tab, Take three tablets by mouth daily., Disp: , Rfl:     tiZANidine (ZANAFLEX) 2 mg tablet, Take one tablet by mouth three times daily as needed., Disp: 30 tablet, Rfl: 3  Allergies   Allergen Reactions    Other [Unclassified Drug] HIVES  Pt stated had a reaction to a migraine medication but doesn't remember the name.     Physical Exam  Vitals:    05/02/22 1600   BP: (!) (P) 154/90   Pulse: (P) 88   SpO2: (P) 100%   Weight: 64.9 kg (143 lb)   Height: 165.1 cm (5' 5)     Oswestry Total Score:: 44     Body mass index is 23.8 kg/m?Marland Kitchen  General: Alert and oriented, very pleasant female.   HEENT showed extraocular muscles were intact and no other abnormalities.  Unlabored breathing.  Regular rate and rhythm on CV exam.   5/5 strength in bilateral upper and lower extremities.    Sensation is intact to light touch and equal in the upper and lower extremities.   There is bilateral lumbar tenderness to palpation         IMPRESSION:  1. Lumbar radiculopathy          PLAN: We will refill oxycodone as described above and continue with home exercise plan as prescribed by physical therapy.  Will schedule a repeat lumbar epidural steroid injection under fluoroscopy at L5-S1

## 2022-05-17 ENCOUNTER — Encounter: Admit: 2022-05-17 | Discharge: 2022-05-17 | Payer: MEDICARE

## 2022-05-17 DIAGNOSIS — C8332 Diffuse large B-cell lymphoma, intrathoracic lymph nodes: Secondary | ICD-10-CM

## 2022-05-17 DIAGNOSIS — M792 Neuralgia and neuritis, unspecified: Secondary | ICD-10-CM

## 2022-05-17 DIAGNOSIS — M48061 Spinal stenosis, lumbar region without neurogenic claudication: Secondary | ICD-10-CM

## 2022-05-17 DIAGNOSIS — C8590 Non-Hodgkin lymphoma, unspecified, unspecified site: Secondary | ICD-10-CM

## 2022-05-17 DIAGNOSIS — C801 Malignant (primary) neoplasm, unspecified: Secondary | ICD-10-CM

## 2022-05-17 DIAGNOSIS — R079 Chest pain, unspecified: Secondary | ICD-10-CM

## 2022-05-17 DIAGNOSIS — I10 Essential (primary) hypertension: Secondary | ICD-10-CM

## 2022-05-17 LAB — CBC AND DIFF
ABSOLUTE BASO COUNT: 0.1 K/UL (ref 0–0.20)
ABSOLUTE EOS COUNT: 0.1 K/UL (ref 0–0.45)
ABSOLUTE LYMPH COUNT: 1.3 K/UL (ref 1.0–4.8)
ABSOLUTE MONO COUNT: 0.3 K/UL (ref 0–0.80)
ABSOLUTE NEUTROPHIL: 2.5 K/UL (ref 1.8–7.0)
BASOPHILS %: 1 % (ref 0–2)
EOSINOPHILS %: 2 % (ref >60)
HEMATOCRIT: 38 % (ref 36–45)
HEMOGLOBIN: 12 g/dL (ref 12.0–15.0)
LYMPHOCYTES %: 30 % (ref 24–44)
MCH: 29 pg (ref 26–34)
MCHC: 33 g/dL (ref 32.0–36.0)
MCV: 89 FL (ref 80–100)
MONOCYTES %: 8 % (ref 4–12)
MPV: 8.1 FL (ref 7–11)
NEUTROPHILS %: 59 % (ref 41–77)
PLATELET COUNT: 230 K/UL (ref 150–400)
RBC COUNT: 4.3 M/UL (ref 4.0–5.0)
RDW: 14 % (ref 11–15)
WBC COUNT: 4.3 K/UL — ABNORMAL LOW (ref 4.5–11.0)

## 2022-05-17 LAB — COMPREHENSIVE METABOLIC PANEL
POTASSIUM: 3.6 MMOL/L (ref 3.5–5.1)
SODIUM: 140 MMOL/L (ref 137–147)
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
## 2022-05-20 ENCOUNTER — Encounter: Admit: 2022-05-20 | Discharge: 2022-05-20 | Payer: MEDICARE

## 2022-05-20 NOTE — Telephone Encounter
Kara Andersen called top ask about her WBC results from her 04/19 visit.  Returned her call and reviewed that her labs are stable and have been reviewed by Dr Kara Andersen at his visit. Per Dr Kara Andersen note, "She has no signs or symptoms suggestive of relapsed or recurrent lymphoma.  Continue active surveillance with symptom directed imaging."    She said she is taking zinc, vitamin C and B12 daily. Told her there is no contra indication to do this. She was appreciative for the return call.

## 2022-06-06 ENCOUNTER — Encounter: Admit: 2022-06-06 | Discharge: 2022-06-06 | Payer: MEDICARE

## 2022-06-06 DIAGNOSIS — M5416 Radiculopathy, lumbar region: Secondary | ICD-10-CM

## 2022-06-12 ENCOUNTER — Ambulatory Visit: Admit: 2022-06-12 | Discharge: 2022-06-12 | Payer: MEDICARE

## 2022-06-12 ENCOUNTER — Encounter: Admit: 2022-06-12 | Discharge: 2022-06-12 | Payer: MEDICARE

## 2022-06-12 DIAGNOSIS — M5416 Radiculopathy, lumbar region: Secondary | ICD-10-CM

## 2022-06-12 DIAGNOSIS — C8590 Non-Hodgkin lymphoma, unspecified, unspecified site: Secondary | ICD-10-CM

## 2022-06-12 DIAGNOSIS — R079 Chest pain, unspecified: Secondary | ICD-10-CM

## 2022-06-12 DIAGNOSIS — M792 Neuralgia and neuritis, unspecified: Secondary | ICD-10-CM

## 2022-06-12 DIAGNOSIS — I10 Essential (primary) hypertension: Secondary | ICD-10-CM

## 2022-06-12 DIAGNOSIS — M48061 Spinal stenosis, lumbar region without neurogenic claudication: Secondary | ICD-10-CM

## 2022-06-12 DIAGNOSIS — C801 Malignant (primary) neoplasm, unspecified: Secondary | ICD-10-CM

## 2022-06-12 MED ORDER — TRIAMCINOLONE ACETONIDE 40 MG/ML IJ SUSP
80 mg | Freq: Once | EPIDURAL | 0 refills | Status: CP
Start: 2022-06-12 — End: ?
  Administered 2022-06-12: 17:00:00 80 mg via EPIDURAL

## 2022-06-12 MED ORDER — IOHEXOL 240 MG IODINE/ML IV SOLN
1 mL | Freq: Once | EPIDURAL | 0 refills | Status: CP
Start: 2022-06-12 — End: ?
  Administered 2022-06-12: 17:00:00 1 mL via EPIDURAL

## 2022-06-12 NOTE — Patient Instructions
Procedure Completed Today: Lumbar Epidural Steroid Injection L5-S1     Important information following your procedure today: You may drive today    Pain relief may not be immediate. It is possible you may even experience an increase in pain during the first 24-48 hours followed by a gradual decrease of your pain.  Though the procedure is generally safe and complications are rare, we do ask that you be aware of any of the following:   Any swelling, persistent redness, new bleeding, or drainage from the site of the injection.  You should not experience a severe headache.  You should not run a fever over 101? F.  New onset of sharp, severe back & or neck pain.  New onset of upper or lower extremity numbness or weakness.  New difficulty controlling bowel or bladder function after the injection.  New shortness of breath.    If any of these occur, please call to report this occurrence to Dr. Braun at [phone]. If you are calling after 4:00 p.m., on weekends or holidays please call [phone] and ask to have the resident physician on call for the physician paged or go to your local emergency room.  You may experience soreness at the injection site. Ice can be applied at 20 minute intervals. Avoid application of direct heat, hot showers or hot tubs today.  Avoid strenuous activity today. You may resume your regular activities and exercise tomorrow.  Patients with diabetes may see an elevation in blood sugars for 7-10 days after the injection. It is important to pay close attention to your diet, check your blood sugars daily and report extreme elevations to the physician that treats your diabetes.  Patients taking a daily blood thinner can resume their regular dose this evening.  It is important that you take all medications ordered by your pain physician. Taking medication as ordered is an important part of your pain care plan. If you cannot continue the medication plan, please notify the physician.     Possible side effects to steroids that may occur:  Flushing or redness of the face  Irritability  Fluid retention  Change in women?s menses    The following medications were used: Lidocaine , Triamcinolone  , and Contrast Dye

## 2022-06-27 ENCOUNTER — Encounter: Admit: 2022-06-27 | Discharge: 2022-06-27 | Payer: MEDICARE

## 2022-06-27 MED ORDER — GABAPENTIN 600 MG PO TAB
600 mg | ORAL_TABLET | Freq: Three times a day (TID) | ORAL | 3 refills | Status: AC
Start: 2022-06-27 — End: ?

## 2022-06-27 NOTE — Telephone Encounter
Patient requesting refill of gabapentin  Last Office Visit 05/02/22  Next Office Visit 08/08/22  Required labs NA  Ktracks last filled NA

## 2022-07-30 ENCOUNTER — Encounter: Admit: 2022-07-30 | Discharge: 2022-07-30 | Payer: MEDICARE

## 2022-07-30 MED ORDER — TIZANIDINE 2 MG PO TAB
2 mg | ORAL_TABLET | Freq: Three times a day (TID) | ORAL | 0 refills | PRN
Start: 2022-07-30 — End: ?

## 2022-08-08 ENCOUNTER — Encounter: Admit: 2022-08-08 | Discharge: 2022-08-08 | Payer: MEDICARE

## 2022-08-08 DIAGNOSIS — C8590 Non-Hodgkin lymphoma, unspecified, unspecified site: Secondary | ICD-10-CM

## 2022-08-08 DIAGNOSIS — M792 Neuralgia and neuritis, unspecified: Secondary | ICD-10-CM

## 2022-08-08 DIAGNOSIS — R079 Chest pain, unspecified: Secondary | ICD-10-CM

## 2022-08-08 DIAGNOSIS — I10 Essential (primary) hypertension: Secondary | ICD-10-CM

## 2022-08-08 DIAGNOSIS — M48061 Spinal stenosis, lumbar region without neurogenic claudication: Secondary | ICD-10-CM

## 2022-08-08 DIAGNOSIS — C801 Malignant (primary) neoplasm, unspecified: Secondary | ICD-10-CM

## 2022-09-11 ENCOUNTER — Encounter: Admit: 2022-09-11 | Discharge: 2022-09-11 | Payer: MEDICARE

## 2022-09-18 ENCOUNTER — Encounter: Admit: 2022-09-18 | Discharge: 2022-09-18 | Payer: MEDICARE

## 2022-10-19 ENCOUNTER — Encounter: Admit: 2022-10-19 | Discharge: 2022-10-19 | Payer: MEDICARE

## 2022-10-19 MED ORDER — GABAPENTIN 600 MG PO TAB
600 mg | ORAL_TABLET | Freq: Three times a day (TID) | ORAL | 0 refills
Start: 2022-10-19 — End: ?

## 2022-10-31 ENCOUNTER — Encounter: Admit: 2022-10-31 | Discharge: 2022-10-31 | Payer: MEDICARE

## 2022-10-31 MED ORDER — OXYCODONE-ACETAMINOPHEN 7.5-325 MG PO TAB
1 | ORAL_TABLET | Freq: Two times a day (BID) | ORAL | 0 refills | PRN
Start: 2022-10-31 — End: ?

## 2022-10-31 NOTE — Telephone Encounter
Patient requesting refill of oxycodone  Last Office Visit 08/08/22  Next Office Visit 11/21/22  Required labs NA  Ktracks last filled 10/11/22

## 2022-11-18 ENCOUNTER — Encounter: Admit: 2022-11-18 | Discharge: 2022-11-18 | Payer: MEDICARE

## 2022-11-21 ENCOUNTER — Encounter: Admit: 2022-11-21 | Discharge: 2022-11-21 | Payer: MEDICARE

## 2022-11-21 ENCOUNTER — Ambulatory Visit: Admit: 2022-11-21 | Discharge: 2022-11-22 | Payer: MEDICARE

## 2022-11-21 DIAGNOSIS — I10 Essential (primary) hypertension: Secondary | ICD-10-CM

## 2022-11-21 DIAGNOSIS — C8590 Non-Hodgkin lymphoma, unspecified, unspecified site: Secondary | ICD-10-CM

## 2022-11-21 DIAGNOSIS — M792 Neuralgia and neuritis, unspecified: Secondary | ICD-10-CM

## 2022-11-21 DIAGNOSIS — C801 Malignant (primary) neoplasm, unspecified: Secondary | ICD-10-CM

## 2022-11-21 DIAGNOSIS — M5416 Radiculopathy, lumbar region: Secondary | ICD-10-CM

## 2022-11-21 DIAGNOSIS — M48061 Spinal stenosis, lumbar region without neurogenic claudication: Secondary | ICD-10-CM

## 2022-11-21 DIAGNOSIS — R079 Chest pain, unspecified: Secondary | ICD-10-CM

## 2022-11-21 MED ORDER — OXYCODONE-ACETAMINOPHEN 7.5-325 MG PO TAB
1 | ORAL_TABLET | Freq: Two times a day (BID) | ORAL | 0 refills | 2.00000 days | Status: AC | PRN
Start: 2022-11-21 — End: ?

## 2022-11-21 NOTE — Patient Instructions
It was nice to see you today. Thank you for choosing to visit our clinic.      Your time is important and if you had to wait today, we do apologize. Our goal is to run exactly on time; however, on occasion, we get behind in clinic due to unexpected patient issues. Thank you for your patience.    General Instructions:  How to reach me: Please send a MyChart message to the [facility] or leave a voicemail for my nurse, Charlynne Pander [phone].  How to get a medication refill: Please use the MyChart Refill request or contact your pharmacy directly to request medication refills. Please allow 72 business hours for request to be completed.  How to receive your test results: If you have signed up for MyChart, you will receive your test results and messages from me this way. Otherwise, you will get a phone call or letter. If you are expecting results and have not heard from my office within 2 weeks of your testing, please send a MyChart message or call my office.  Scheduling: Our scheduling phone number is [phone].  Appointment Reminders on your cell phone: Communication preferences can be managed in MyChart to ensure you receive important appointment notifications  Support for many chronic illnesses is available through [facility]: [email or web address] or [phone].  For questions on nights, weekends or holidays, call the Operator at [phone], and ask for the doctor on call for [facility].      Again, thank you for coming in today.    ________________________________________________________________

## 2022-11-25 ENCOUNTER — Encounter: Admit: 2022-11-25 | Discharge: 2022-11-25 | Payer: MEDICARE

## 2022-12-06 NOTE — Progress Notes
Name: Kara Andersen          MRN: [id number]      DOB: December 09, 1970      AGE: 52 y.o.   DATE OF SERVICE: 12/09/2022    Subjective:             Reason for Visit:  Follow Up      Kara Andersen is a 52 y.o. female.      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  Kara Andersen completed 6 cycles of R-CHOP with CR.      Kara Andersen is here for clinical evaluation and labs.    Subjective  Working in cafeteria at school  Kara Andersen underwent anterior/posterior lumbar instrumented fusion in April 2021.   Dr. Samara Deist is managing Kara Andersen pain medication.     Kara Andersen is on disability  Kara Andersen recently got custody of Kara Andersen 5 grandchildren  Kara Andersen remains active  Denies fevers or infectious symptoms.    No GI or GU complaints.    No new adenopathy.    No night sweats  Tired  Breast biopsy June 2022--not cancer, monitoring  Up-to-date with PCP    HPI  1.  Presented May 2020 with bulky mediastinal mass.  2.  06/22/2018 core needle biopsy of mediastinal lesion revealed CD10 negative CD30 negative diffuse large B-cell lymphoma.  Both FISH and IHC for MYC were negative.    3.  06/23/2018 PET/CT revealed bulky hypermetabolic mediastinal mass along with lesions in the pleura and liver.  Staging bone marrow was negative.  4.  R-CHOP x 6 with CR.    Kara Andersen is with grand daughter.           Objective:          cyclobenzaprine (FLEXERIL) 10 mg tablet Take one tablet by mouth at bedtime daily.    gabapentin (NEURONTIN) 600 mg tablet TAKE 1 TABLET BY MOUTH THREE TIMES DAILY    naloxone (NARCAN) 4 mg/actuation nasal spray Insert 1 spray into 1 nostril as needed for signs of opioid overdose then call 911. May repeat dose every 2-3 minutes (alternate nostrils) until medical team arrives.  Indications: opioid overdose    [START ON 12/10/2022] oxyCODONE-acetaminophen (PERCOCET) 7.5-325 mg tablet Take one tablet by mouth every 12 hours as needed for Pain. Do not start before December 10, 2022.    [START ON 01/09/2023] oxyCODONE-acetaminophen (PERCOCET) 7.5-325 mg tablet Take one tablet by mouth every 12 hours as needed for Pain. Do not start before January 09, 2023.    [START ON 02/08/2023] oxyCODONE-acetaminophen (PERCOCET) 7.5-325 mg tablet Take one tablet by mouth every 12 hours as needed for Pain. Do not start before February 08, 2023.    Potassium 99 mg tab Take three tablets by mouth daily.    tiZANidine (ZANAFLEX) 2 mg tablet Take one tablet by mouth three times daily as needed.     Vitals:    12/09/22 1527   BP: 135/86   BP Source: Arm, Right Upper   Pulse: 64   Temp: 36.5 ?C (97.7 ?F)   Resp: 15   O2 Device: None (Room air)  TempSrc: Oral   PainSc: Zero   Weight: 62.1 kg (137 lb)       Body mass index is 22.8 kg/m?Marland Kitchen     Pain Score: Zero         Pain Addressed:  N/A  [place] Cooperative Oncology Group performance status is 1, Restricted in physically strenuous activity but ambulatory and able to carry out work of a light or sedentary nature, e.g., light house work, office work.     Physical Exam  Vitals reviewed.   Constitutional:       Appearance: Normal appearance. Kara Andersen is well-developed.   HENT:      Head: Normocephalic.   Cardiovascular:      Rate and Rhythm: Normal rate and regular rhythm.   Pulmonary:      Effort: Pulmonary effort is normal.      Breath sounds: Normal breath sounds.   Abdominal:      Palpations: Abdomen is soft.   Musculoskeletal:         General: Normal range of motion.      Cervical back: Normal range of motion.   Lymphadenopathy:      Cervical: No cervical adenopathy.      Upper Body:      Right upper body: No supraclavicular or axillary adenopathy.      Left upper body: No supraclavicular or axillary adenopathy.   Skin:     General: Skin is warm and dry.   Neurological:      General: No focal deficit present.      Mental Status: Kara Andersen is alert and oriented to person, place, and time.   Psychiatric:         Mood and Affect: Mood normal.            CBC w/Diff    Lab Results   Component Value Date/Time    WBC 4.5 12/09/2022 03:05 PM    WBC 4.3 (L) 05/17/2022 02:59 PM    RBC 4.51 12/09/2022 03:05 PM    RBC 4.33 05/17/2022 02:59 PM    HGB 13.1 12/09/2022 03:05 PM    HGB 12.9 05/17/2022 02:59 PM    HCT 40.0 12/09/2022 03:05 PM    HCT 38.7 05/17/2022 02:59 PM    MCV 88.7 12/09/2022 03:05 PM    MCV 89.3 05/17/2022 02:59 PM    MCH 29.1 12/09/2022 03:05 PM    MCH 29.8 05/17/2022 02:59 PM    MCHC 32.8 12/09/2022 03:05 PM    MCHC 33.4 05/17/2022 02:59 PM    RDW 12.9 12/09/2022 03:05 PM    RDW 14.5 05/17/2022 02:59 PM    PLTCT 202 12/09/2022 03:05 PM    PLTCT 230 05/17/2022 02:59 PM    MPV 7.7 12/09/2022 03:05 PM    MPV 8.1 05/17/2022 02:59 PM    Lab Results   Component Value Date/Time    NEUT 58 12/09/2022 03:05 PM    NEUT 59 05/17/2022 02:59 PM    ANC 2.60 12/09/2022 03:05 PM    ANC 2.50 05/17/2022 02:59 PM    LYMA 32 12/09/2022 03:05 PM    LYMA 30 05/17/2022 02:59 PM    ALC 1.40 12/09/2022 03:05 PM    ALC 1.30 05/17/2022 02:59 PM    MONA 7 12/09/2022 03:05 PM    MONA 8 05/17/2022 02:59 PM    AMC 0.30 12/09/2022 03:05 PM    AMC 0.30 05/17/2022 02:59 PM    EOSA 2 12/09/2022 03:05 PM    EOSA 2 05/17/2022 02:59 PM  AEC 0.10 12/09/2022 03:05 PM    AEC 0.10 05/17/2022 02:59 PM    BASA 1 12/09/2022 03:05 PM    BASA 1 05/17/2022 02:59 PM    ABC 0.00 12/09/2022 03:05 PM    ABC 0.10 05/17/2022 02:59 PM             Assessment and Plan:          Stage IV diffuse large B-cell lymphoma with bulky mediastinal disease and elevated IPI =3 (LDH, stage, extranodal sites). Kara Andersen has no evidence of disease on exam today.  We will continue active surveillance.  Kara Andersen will follow up with Dr. Mikey Bussing as scheduled. Kara Andersen will call in the interim with any problems or concerns.  Kara Andersen has a primary doctor, Dr. Maisie Fus.  We discussed routine screening and vaccinations.  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  Kara Andersen underwent surgery in April 2021 with improvement with Dr. Jean Rosenthal.  Kara Andersen sees Dr. Samara Deist routinely and he manages Kara Andersen pain medications.

## 2022-12-09 ENCOUNTER — Encounter: Admit: 2022-12-09 | Discharge: 2022-12-09 | Payer: MEDICARE

## 2022-12-09 DIAGNOSIS — C8332 Diffuse large B-cell lymphoma, intrathoracic lymph nodes: Secondary | ICD-10-CM

## 2023-01-21 ENCOUNTER — Ambulatory Visit: Admit: 2023-01-21 | Discharge: 2023-01-21 | Payer: MEDICARE

## 2023-01-21 ENCOUNTER — Encounter: Admit: 2023-01-21 | Discharge: 2023-01-21 | Payer: MEDICARE

## 2023-01-21 DIAGNOSIS — M5416 Radiculopathy, lumbar region: Secondary | ICD-10-CM

## 2023-01-21 MED ORDER — IOHEXOL 300 MG IODINE/ML IV SOLN
1 mL | Freq: Once | 0 refills | Status: CP
Start: 2023-01-21 — End: ?

## 2023-01-21 MED ORDER — LIDOCAINE (PF) 10 MG/ML (1 %) IJ SOLN
2 mL | Freq: Once | INTRAMUSCULAR | 0 refills | Status: CP
Start: 2023-01-21 — End: ?

## 2023-01-21 MED ORDER — TRIAMCINOLONE ACETONIDE 40 MG/ML IJ SUSP
80 mg | Freq: Once | EPIDURAL | 0 refills | Status: CP
Start: 2023-01-21 — End: ?

## 2023-01-21 MED ORDER — SODIUM CHLORIDE 0.9 % IJ SOLN
3 mL | Freq: Once | INTRAMUSCULAR | 0 refills | Status: CP
Start: 2023-01-21 — End: ?

## 2023-01-21 NOTE — Procedures
Attending Surgeon: Gabriel Rung, MD    Anesthesia: Local    Pre-Procedure Diagnosis:   1. Lumbar radiculopathy        Post-Procedure Diagnosis:   1. Lumbar radiculopathy             Epidural Steroid Injection Lumbar/Caudal  Procedure: epidural - interlaminar    Laterality: n/a   on 01/21/2023 11:45 AM  Location: lumbar ESI with imaging - L5-S1      Consent:   Consent obtained: written  Consent given by: patient       Universal Protocol:  Relevant documents: relevant documents present and verified  Test results: test results available and properly labeled  Imaging studies: imaging studies available  Required items: required blood products, implants, devices, and special equipment available  Site marked: the operative site was marked  Patient identity confirmed: Patient identify confirmed verbally with patient.        Time out: Immediately prior to procedure a time out was called to verify the correct patient, procedure, equipment, support staff and site/side marked as required      Procedures Details:   Indications: pain   Prep: chlorhexidine  Patient position: prone  Specimens: none  Number of Levels: 1  Approach: midline  Guidance: fluoroscopy  Contrast: Procedure confirmed with contrast under live fluoroscopy.  Needle and Epidural Catheter: tuohy  Needle size: 18 G  Injection procedure: Negative aspiration for blood  Patient tolerance: Patient tolerated the procedure well with no immediate complications. Pressure was applied, and hemostasis was accomplished.  Comments: Kenalog 80 mg and normal saline 2 ml was injected    Administrations This Visit       iohexoL (OMNIPAQUE-300) 300 mg/mL injection 1 mL       Admin Date  01/21/2023 Action  Given Dose  1 mL Route  SEE ADMIN INSTRUCTIONS Documented By  Ledora Bottcher, RN              lidocaine PF 1% (10 mg/mL) injection 2 mL       Admin Date  01/21/2023 Action  Given Dose  2 mL Route  Injection Documented By  Ledora Bottcher, RN              sodium chloride PF 0.9% injection 3 mL       Admin Date  01/21/2023 Action  Given Dose  3 mL Route  Injection Documented By  Ledora Bottcher, RN              triamcinolone acetonide (KENALOG-40) injection 80 mg       Admin Date  01/21/2023 Action  Given Dose  80 mg Route  Epidural Documented By  Ledora Bottcher, RN                  Estimated blood loss: none or minimal  Specimens: none  Patient tolerated the procedure well with no immediate complications. Pressure was applied, and hemostasis was accomplished.

## 2023-01-21 NOTE — Discharge Instructions - Supplementary Instructions
 GENERAL POST PROCEDURE INSTRUCTIONS  Physician: _________________________________  Procedure Completed Today:  Joint Injection (hip, knee, shoulder)  Cervical Epidural Steroid Injection  Cervical Transforaminal Steroid Injection  Trigger Point Injection  Caudal Epidural Steroid Injection  Piriformis Injection  Pudendal Nerve Block  Other _____________________ Thoracic Epidural Steroid Injection  Lumbar Epidural Steroid Injection  Lumbar Transforaminal Steroid Injection  Facet Joint Injection  Celiac Nerve Block  Sacrococcygeal  Sacroiliac Joint Injection   Important information following your procedure today:  You may drive today     If you had sedation, you may NOT drive today  Rest at home for the next 6 hours.  You may then begin to resume your normal activities.  DO NOT drive any vehicle, operate any power tools, drink alcohol, make any major decisions, or sign any legal documents for the next 12 hours.  Pain relief may not be immediate. It is possible you may even experience an increase in pain during the first 24-48 hours followed by a gradual decrease of your pain.  Though the procedure is generally safe, and complications are rare, we do ask that you be aware of any of the following:  Any swelling, persistent redness, new bleeding or drainage from the site of the injection.  You should not experience a severe headache.  You should not run a fever over 101oF.  New onset of sharp, severe back and or neck pain.  New onset of upper or lower extremity numbness or weakness.  New difficulty controlling bowel or bladder function after injection.  New shortness of breath.  ** If any of these occur, please call to report this occurrence to the nurse of Dr. Samara Deist at [phone]. If you are calling after 4:00 p.m. or on weekends or holidays, please call [phone] and ask to have the resident physician on call for the physician paged or go to your local emergency room.  You may experience soreness at the injection site. Ice can be applied at 20-minute intervals for the first 24 hours. The following day you may alternate ice with heat if you are experiencing muscle tightness, otherwise continue with ice. Ice works best at decreasing pain. Avoid application of direct heat, hot showers or hot tubs today.  Avoid strenuous activity today. You many resume your regular activities and exercise tomorrow.  Patients with diabetes may see an elevation in blood sugars for 7-10 days after the injection. It is important to pay close attention to your diet, check your blood sugars daily and report extreme elevations to the physician that manages your diabetes.  Patients taking daily blood thinners can resume their regular dose this evening.  It is important that you take all medications ordered by your pain physician. Taking medications as ordered is an important part of your pain care plan. If you cannot continue the medication plan, please notify the physician.    Possible side effects to steroids that may occur:  Flushing or redness of the face  Irritability  Fluid retention  Change in women's menses  Minor headache    If you are unable to keep your upcoming appointment, please notify the [facility] scheduler at [phone] at least 24 hours in advance. If you have questions for the surgery center, call [facility] at [phone].
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
## 2023-02-17 ENCOUNTER — Encounter: Admit: 2023-02-17 | Discharge: 2023-02-17 | Payer: MEDICARE

## 2023-02-19 ENCOUNTER — Encounter: Admit: 2023-02-19 | Discharge: 2023-02-19 | Payer: MEDICARE

## 2023-02-19 MED ORDER — GABAPENTIN 600 MG PO TAB
600 mg | ORAL_TABLET | Freq: Three times a day (TID) | ORAL | 0 refills
Start: 2023-02-19 — End: ?

## 2023-02-20 ENCOUNTER — Encounter: Admit: 2023-02-20 | Discharge: 2023-02-20 | Payer: MEDICARE

## 2023-02-21 ENCOUNTER — Encounter: Admit: 2023-02-21 | Discharge: 2023-02-21 | Payer: MEDICARE

## 2023-02-21 MED ORDER — GABAPENTIN 600 MG PO TAB
600 mg | ORAL_TABLET | Freq: Three times a day (TID) | ORAL | 2 refills | Status: AC
Start: 2023-02-21 — End: ?

## 2023-03-13 ENCOUNTER — Encounter: Admit: 2023-03-13 | Discharge: 2023-03-13 | Payer: MEDICARE

## 2023-03-13 ENCOUNTER — Ambulatory Visit: Admit: 2023-03-13 | Discharge: 2023-03-13 | Payer: MEDICARE

## 2023-03-13 DIAGNOSIS — M5416 Radiculopathy, lumbar region: Secondary | ICD-10-CM

## 2023-03-13 MED ORDER — OXYCODONE-ACETAMINOPHEN 7.5-325 MG PO TAB
1 | ORAL_TABLET | Freq: Two times a day (BID) | ORAL | 0 refills | 2.00000 days | Status: AC | PRN
Start: 2023-03-13 — End: ?

## 2023-03-13 NOTE — Progress Notes
SPINE [facility]ENTER CLINIC NOTE       SUBJECTIVE: Kara Andersen presents for care regarding back and leg pain.  Pain is bilateral and intermittently sharp and dull.  Walking and standing exacerbates pain.  Pain is improved with rest.  For pain relief she is taking Percocet 7.5-325 mg twice per day.  She is also taking Zanaflex 2 mg p.o. 3 times daily as needed.  On this regimen her pain is currently well-controlled.  She is performing home exercise plan as prescribed by physical therapy for core strengthening.    L SPINE MRI:    IMPRESSION       1. 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     2.  Moderate left foraminal stenosis at L5-S1. Mild left lateral recess   narrowing at T12-L1.        Finalized by Shanna Cisco, M.D. on 02/09/2019 3:40 PM. Dictated by Shanna Cisco, M.D. on 02/08/2019 4:30 PM.            Review of Systems a 14 system review is negative except for back and leg pain.    Current Outpatient Medications:     cyclobenzaprine (FLEXERIL) 10 mg tablet, Take one tablet by mouth at bedtime daily., Disp: 90 tablet, Rfl: 3    gabapentin (NEURONTIN) 600 mg tablet, Take one tablet by mouth three times daily., Disp: 90 tablet, Rfl: 2    naloxone (NARCAN) 4 mg/actuation nasal spray, Insert 1 spray into 1 nostril as needed for signs of opioid overdose then call 911. May repeat dose every 2-3 minutes (alternate nostrils) until medical team arrives.  Indications: opioid overdose, Disp: 2 each, Rfl: 1    oxyCODONE-acetaminophen (PERCOCET) 7.5-325 mg tablet, Take one tablet by mouth every 12 hours as needed for Pain., Disp: 60 tablet, Rfl: 0    oxyCODONE-acetaminophen (PERCOCET) 7.5-325 mg tablet, Take one tablet by mouth every 12 hours as needed for Pain. Do not start before December 10, 2022., Disp: 60 tablet, Rfl: 0    oxyCODONE-acetaminophen (PERCOCET) 7.5-325 mg tablet, Take one tablet by mouth every 12 hours as needed for Pain. Do not start before January 09, 2023., Disp: 60 tablet, Rfl: 0    Potassium 99 mg tab, Take three tablets by mouth daily., Disp: , Rfl:     tiZANidine (ZANAFLEX) 2 mg tablet, Take one tablet by mouth three times daily as needed., Disp: 90 tablet, Rfl: 3  Allergies   Allergen Reactions    Other [Unclassified Drug] HIVES     Pt stated had a reaction to a migraine medication but doesn't remember the name.     Physical Exam  Vitals:    03/13/23 1547   BP: (!) (P) 172/86   Pulse: (P) 74   SpO2: (P) 100%   Weight: (P) 62.1 kg (137 lb)           Body mass index is 22.8 kg/m? (pended).  General: Alert and oriented, very pleasant female.   HEENT showed extraocular muscles were intact and no other abnormalities.  Unlabored breathing.  Regular rate and rhythm on CV exam.   5/5 strength in bilateral upper and lower extremities.    Sensation is intact to light touch and equal in the upper and lower extremities.   There is bilateral lumbar tenderness to palpation         IMPRESSION:  1. Chronic pain of both knees    2. Lumbar radiculopathy          PLAN: Urine  was obtained today for urine drug screen.  Will continue with Percocet and tizanidine as described above, prescriptions were provided..  Will continue with home exercise plan as prescribed by physical therapy.  A prescription for knee brace was also provided due to pain and instability due to weakness.  Follow-up in 3 months or sooner if needed.

## 2023-03-14 DIAGNOSIS — M25561 Pain in right knee: Secondary | ICD-10-CM

## 2023-03-14 DIAGNOSIS — G8929 Other chronic pain: Secondary | ICD-10-CM

## 2023-03-14 DIAGNOSIS — M25562 Pain in left knee: Secondary | ICD-10-CM

## 2023-03-18 ENCOUNTER — Encounter: Admit: 2023-03-18 | Discharge: 2023-03-18 | Payer: MEDICARE

## 2023-03-18 DIAGNOSIS — Z79899 Other long term (current) drug therapy: Secondary | ICD-10-CM

## 2023-04-09 ENCOUNTER — Encounter: Admit: 2023-04-09 | Discharge: 2023-04-09 | Payer: MEDICARE

## 2023-04-09 MED ORDER — OXYCODONE-ACETAMINOPHEN 7.5-325 MG PO TAB
1 | ORAL_TABLET | Freq: Two times a day (BID) | ORAL | 0 refills | 2.00000 days | Status: AC | PRN
Start: 2023-04-09 — End: ?

## 2023-04-09 NOTE — Telephone Encounter
 Patient requesting refill of oxycodone  Last Office Visit 03/13/23  Next Office Visit 06/12/23  Required labs NA  [facility] last filled 03/13/23

## 2023-05-16 ENCOUNTER — Encounter: Admit: 2023-05-16 | Discharge: 2023-05-16 | Payer: MEDICAID

## 2023-05-16 DIAGNOSIS — C8332 Diffuse large B-cell lymphoma, intrathoracic lymph nodes: Secondary | ICD-10-CM
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
## 2023-06-10 ENCOUNTER — Encounter: Admit: 2023-06-10 | Discharge: 2023-06-10 | Payer: MEDICAID

## 2023-06-12 ENCOUNTER — Encounter: Admit: 2023-06-12 | Discharge: 2023-06-12 | Payer: MEDICAID

## 2023-06-12 ENCOUNTER — Ambulatory Visit: Admit: 2023-06-12 | Discharge: 2023-06-13 | Payer: MEDICAID

## 2023-06-12 DIAGNOSIS — M5416 Radiculopathy, lumbar region: Secondary | ICD-10-CM

## 2023-06-12 MED ORDER — OXYCODONE-ACETAMINOPHEN 7.5-325 MG PO TAB
1 | ORAL_TABLET | Freq: Two times a day (BID) | ORAL | 0 refills | 2.00000 days | Status: AC | PRN
Start: 2023-06-12 — End: ?

## 2023-06-12 NOTE — Progress Notes
 [facility]INE [facility] CLINIC NOTE       SUBJECTIVE: Kara Andersen presents for care regarding back and lower extremity pain via Zoom video telehealth conference.  Her pain is in the bilateral lumbar region described as sharp or dull.  Lumbar epidural steroid injection at L5-S1 reduce pain by about 50% for about 3 months.  She would like to have a repeat injection scheduled.  For pain relief she is taking Percocet 7.5-325 mg approximately twice per day.  She is performing the home exercise plan as prescribed by physical therapy.        Review of Systems    Current Outpatient Medications:     cyclobenzaprine (FLEXERIL) 10 mg tablet, Take one tablet by mouth at bedtime daily., Disp: 90 tablet, Rfl: 3    gabapentin (NEURONTIN) 600 mg tablet, Take one tablet by mouth three times daily., Disp: 90 tablet, Rfl: 2    naloxone (NARCAN) 4 mg/actuation nasal spray, Insert 1 spray into 1 nostril as needed for signs of opioid overdose then call 911. May repeat dose every 2-3 minutes (alternate nostrils) until medical team arrives.  Indications: opioid overdose, Disp: 2 each, Rfl: 1    oxyCODONE-acetaminophen (PERCOCET) 7.5-325 mg tablet, Take one tablet by mouth every 12 hours as needed for Pain., Disp: 60 tablet, Rfl: 0    [START ON 07/12/2023] oxyCODONE-acetaminophen (PERCOCET) 7.5-325 mg tablet, Take one tablet by mouth every 12 hours as needed for Pain. Do not start before July 12, 2023., Disp: 60 tablet, Rfl: 0    [START ON 08/11/2023] oxyCODONE-acetaminophen (PERCOCET) 7.5-325 mg tablet, Take one tablet by mouth every 12 hours as needed for Pain. Do not start before August 11, 2023., Disp: 60 tablet, Rfl: 0    oxyCODONE-acetaminophen (PERCOCET) 7.5-325 mg tablet, Take one tablet by mouth every 12 hours as needed for Pain. Do not start before April 12, 2023., Disp: 60 tablet, Rfl: 0    oxyCODONE-acetaminophen (PERCOCET) 7.5-325 mg tablet, Take one tablet by mouth every 12 hours as needed for Pain., Disp: 60 tablet, Rfl: 0    Potassium 99 mg tab, Take three tablets by mouth daily., Disp: , Rfl:     tiZANidine (ZANAFLEX) 2 mg tablet, Take one tablet by mouth three times daily as needed., Disp: 90 tablet, Rfl: 3  Allergies   Allergen Reactions    Other [Unclassified Drug] HIVES     Pt stated had a reaction to a migraine medication but doesn't remember the name.     Physical Exam  There were no vitals filed for this visit.        There is no height or weight on file to calculate BMI.  General: Alert and oriented, very pleasant female.   HEENT showed extraocular muscles were intact and no other abnormalities.  Unlabored breathing.  There is full range of motion in the neck and torso         IMPRESSION:  1. Lumbar radiculopathy          PLAN: Will schedule a repeat lumbar epidural steroid injection at L5-S1.  Will continue with home exercise plan as prescribed by physical therapy.  Prescriptions for Percocet 7.5-325 mg were sent toher pharmacy and will schedule a 82-month clinic follow-up or sooner if needed.      Visit Start Time 13:05 Visit End Time 13:36.

## 2023-07-04 ENCOUNTER — Encounter: Admit: 2023-07-04 | Discharge: 2023-07-04 | Payer: MEDICARE

## 2023-07-09 ENCOUNTER — Encounter: Admit: 2023-07-09 | Discharge: 2023-07-09 | Payer: MEDICARE

## 2023-07-16 ENCOUNTER — Encounter: Admit: 2023-07-16 | Discharge: 2023-07-16 | Payer: MEDICARE

## 2023-07-16 ENCOUNTER — Ambulatory Visit: Admit: 2023-07-16 | Discharge: 2023-07-16 | Payer: MEDICARE

## 2023-07-16 ENCOUNTER — Ambulatory Visit: Admit: 2023-07-16 | Discharge: 2023-07-17 | Payer: MEDICARE

## 2023-09-11 ENCOUNTER — Encounter: Admit: 2023-09-11 | Discharge: 2023-09-11 | Payer: MEDICARE

## 2023-09-11 MED ORDER — OXYCODONE-ACETAMINOPHEN 7.5-325 MG PO TAB
1 | ORAL_TABLET | Freq: Two times a day (BID) | ORAL | 0 refills | 2.00000 days | Status: AC | PRN
Start: 2023-09-11 — End: ?

## 2023-09-11 NOTE — Telephone Encounter
 Patient called to get refill on her oxycodone . Attempted to call and inform patient that she is past due for her 3 month follow up appt. No option to leave a message. Will attempt to try again later today.

## 2023-09-11 NOTE — Telephone Encounter
 Patient requesting refill of oxycodone   Last Office Visit 06/12/23  Next Office Visit 09/17/23  Required labs NA  [facility] last filled 08/11/23

## 2023-09-17 ENCOUNTER — Encounter: Admit: 2023-09-17 | Discharge: 2023-09-17 | Payer: MEDICARE

## 2023-09-17 ENCOUNTER — Ambulatory Visit: Admit: 2023-09-17 | Discharge: 2023-09-18 | Payer: MEDICARE

## 2023-09-17 DIAGNOSIS — M5416 Radiculopathy, lumbar region: Principal | ICD-10-CM

## 2023-09-17 DIAGNOSIS — Z79899 Other long term (current) drug therapy: Secondary | ICD-10-CM

## 2023-09-17 MED ORDER — OXYCODONE-ACETAMINOPHEN 7.5-325 MG PO TAB
1 | ORAL_TABLET | Freq: Two times a day (BID) | ORAL | 0 refills | 2.00000 days | Status: AC | PRN
Start: 2023-09-17 — End: ?

## 2023-09-17 NOTE — Progress Notes
 [facility]INIC [facility]       SUBJECTIVE:   Ms. Behlke presents via video Telehealth for pain management of chronic low back pain and medication management. She experiences chronic low back pain without radiation to her legs, numbness, or tingling. Her pain is managed with oxycodone -acetaminophen  7.5/325 mg Q12 PRN, taken two to three times daily depending on activity level, particularly when working on a concrete floor. She tolerates oxycodone  well without side effects.    She recently underwent an L5-S1 LESI, which she described as 'not bad' and providing 'about a little bit of relief maybe.' Less than two weeks ago, she experienced pain radiating down her right leg, leading to an ER visit where she received pain medication and a muscle relaxer, which alleviated the pain. Increased exercise has contributed to her relief, and she typically experiences about 50% relief from epidural injections.    She also takes gabapentin , currently at a dosage of 600 mg twice daily, reduced from three times daily. She switched from cyclobenzaprine  to tizanidine  due to excessive sedation from cyclobenzaprine , which was challenging given her responsibilities caring for five children, including a five-year-old.         Review of Systems    Current Outpatient Medications:     cyclobenzaprine  (FLEXERIL ) 10 mg tablet, Take one tablet by mouth at bedtime daily., Disp: 90 tablet, Rfl: 3    gabapentin  (NEURONTIN ) 600 mg tablet, TAKE 1 TABLET BY MOUTH THREE TIMES DAILY, Disp: 90 tablet, Rfl: 2    naloxone  (NARCAN ) 4 mg/actuation nasal spray, Insert 1 spray into 1 nostril as needed for signs of opioid overdose then call 911. May repeat dose every 2-3 minutes (alternate nostrils) until medical team arrives.  Indications: opioid overdose, Disp: 2 each, Rfl: 1    oxyCODONE -acetaminophen  (PERCOCET) 7.5-325 mg tablet, Take one tablet by mouth every 12 hours as needed for Pain., Disp: 60 tablet, Rfl: 0    Potassium 99 mg tab, Take three tablets by mouth daily., Disp: , Rfl:     tiZANidine  (ZANAFLEX ) 2 mg tablet, Take one tablet by mouth three times daily as needed., Disp: 90 tablet, Rfl: 3  Allergies   Allergen Reactions    Other [Unclassified Drug] HIVES     Pt stated had a reaction to a migraine medication but doesn't remember the name.     Physical Exam  Vitals:    09/17/23 1006   PainSc: Six   Weight: 59 kg (130 lb)   Height: 165.1 cm (5' 5)        Pain Score: Six  Body mass index is 21.63 kg/m?[name]      General: Alert, oriented, no distress  HEENT: normocephalic  Resp: Non labored breathing, no distress  MS: Sits during call .  Behavior: Calm, cooperative, behavior and speech normal.        IMPRESSION:  1. Lumbar radiculopathy    2. 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 Epidural injections provide 50% relief for over three months. Recent flare-up required ER visit.  - Continue oxycodone  Q12 hours as needed.  - Oxycodone  should be taken as prescribed. Dr. Abram and this APRN are in agreement that this medication should only be taken two times per day, as defined on the prescription bottle.  - Adjust gabapentin  to 600 mg twice daily on EMR.  - Continue tizanidine  as needed.  - She may repeat  L5-S1 LESI at the three-month interval if her pain returns.  - Ensure three months of medication refills.  - Schedule follow-up in 12 weeks for medication check.      Opioid Refill:  Encouraged strengthening, stretching and conservative therapies  Continue medication as prescribed: oxycodone -acetaminophen  7.5/325 mg Q12 PRN   Refills provided x 3 mo   Discussed medication side effects & risks  Last toxicology 03/13/23 - compliant  PDMP reviewed: 09/17/23    No aberrant behavior.   12 week follow-up     Pt reports last dose of meds taken 09/17/23  Last fill: 09/11/23

## 2023-09-23 ENCOUNTER — Encounter: Admit: 2023-09-23 | Discharge: 2023-09-23 | Payer: MEDICARE

## 2023-09-23 MED ORDER — TIZANIDINE 2 MG PO TAB
2 mg | ORAL_TABLET | Freq: Three times a day (TID) | ORAL | 2 refills | 30.00000 days | Status: AC | PRN
Start: 2023-09-23 — End: ?

## 2023-09-23 NOTE — Telephone Encounter
 Patient requesting refill of tizanidine   Last Office Visit 09/17/23  Next Office Visit procedure on 10/22/23  Required labs CMP done 05/16/23  [facility] last filled NA

## 2023-10-15 ENCOUNTER — Encounter: Admit: 2023-10-15 | Discharge: 2023-10-15 | Payer: MEDICARE

## 2023-10-22 ENCOUNTER — Encounter: Admit: 2023-10-22 | Discharge: 2023-10-22 | Payer: MEDICARE

## 2023-10-22 ENCOUNTER — Ambulatory Visit: Admit: 2023-10-22 | Discharge: 2023-10-22 | Payer: MEDICARE

## 2023-10-22 ENCOUNTER — Ambulatory Visit: Admit: 2023-10-22 | Discharge: 2023-10-23 | Payer: MEDICARE

## 2023-11-07 ENCOUNTER — Encounter: Admit: 2023-11-07 | Discharge: 2023-11-07 | Payer: MEDICARE

## 2023-11-07 MED ORDER — GABAPENTIN 600 MG PO TAB
600 mg | ORAL_TABLET | Freq: Three times a day (TID) | ORAL | 2 refills | 30.00000 days | Status: AC
Start: 2023-11-07 — End: ?

## 2023-11-07 NOTE — Telephone Encounter
 Patient requesting refill of gabapentin   Last Office Visit 09/17/23  Next Office Visit NA  Required labs NA  [facility] last filled NA

## 2023-12-22 ENCOUNTER — Encounter: Admit: 2023-12-22 | Discharge: 2023-12-22 | Payer: MEDICARE

## 2023-12-22 ENCOUNTER — Ambulatory Visit: Admit: 2023-12-22 | Discharge: 2023-12-23 | Payer: MEDICARE

## 2023-12-22 VITALS — BP 161/91 | HR 65 | Ht 65.0 in | Wt 135.0 lb

## 2023-12-22 DIAGNOSIS — M5416 Radiculopathy, lumbar region: Principal | ICD-10-CM

## 2023-12-22 MED ORDER — OXYCODONE-ACETAMINOPHEN 7.5-325 MG PO TAB
1 | ORAL_TABLET | Freq: Two times a day (BID) | ORAL | 0 refills | 2.00000 days | Status: AC | PRN
Start: 2023-12-22 — End: ?

## 2023-12-22 MED ORDER — GABAPENTIN 600 MG PO TAB
600 mg | ORAL_TABLET | Freq: Three times a day (TID) | ORAL | 2 refills | 30.00000 days | Status: AC
Start: 2023-12-22 — End: ?

## 2023-12-22 MED ORDER — TIZANIDINE 2 MG PO TAB
2 mg | ORAL_TABLET | Freq: Three times a day (TID) | ORAL | 2 refills | 30.00000 days | Status: AC | PRN
Start: 2023-12-22 — End: ?
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
## 2024-01-16 ENCOUNTER — Encounter: Admit: 2024-01-16 | Discharge: 2024-01-16 | Payer: MEDICARE

## 2024-01-27 ENCOUNTER — Encounter: Admit: 2024-01-27 | Discharge: 2024-01-27 | Payer: MEDICARE

## 2024-02-17 ENCOUNTER — Encounter: Admit: 2024-02-17 | Discharge: 2024-02-17 | Payer: MEDICARE

## 2024-02-25 ENCOUNTER — Ambulatory Visit: Admit: 2024-02-25 | Discharge: 2024-02-25 | Payer: MEDICARE

## 2024-02-25 ENCOUNTER — Ambulatory Visit: Admit: 2024-02-25 | Discharge: 2024-02-26 | Payer: MEDICARE

## 2024-02-25 ENCOUNTER — Encounter: Admit: 2024-02-25 | Discharge: 2024-02-25 | Payer: MEDICARE
